# Patient Record
Sex: Male | Born: 1945 | Race: White | Hispanic: No | Marital: Married | Smoking: Former smoker
Health system: Southern US, Community
[De-identification: ages and names within clinical notes are randomized; demographics above are authoritative.]

## PROBLEM LIST (undated history)

## (undated) DIAGNOSIS — J189 Pneumonia, unspecified organism: Secondary | ICD-10-CM

## (undated) DIAGNOSIS — M1990 Unspecified osteoarthritis, unspecified site: Secondary | ICD-10-CM

## (undated) DIAGNOSIS — M549 Dorsalgia, unspecified: Secondary | ICD-10-CM

## (undated) DIAGNOSIS — G709 Myoneural disorder, unspecified: Secondary | ICD-10-CM

## (undated) DIAGNOSIS — G4730 Sleep apnea, unspecified: Secondary | ICD-10-CM

## (undated) HISTORY — DX: Dorsalgia, unspecified: M54.9

## (undated) HISTORY — PX: LUMBAR SPINE SURGERY: SHX701

## (undated) HISTORY — PX: WISDOM TOOTH EXTRACTION: SHX21

## (undated) HISTORY — DX: Sleep apnea, unspecified: G47.30

---

## 2009-06-26 HISTORY — PX: LUMBAR SPINE SURGERY: SHX701

## 2009-07-01 ENCOUNTER — Inpatient Hospital Stay (HOSPITAL_COMMUNITY): Admission: RE | Admit: 2009-07-01 | Discharge: 2009-07-04 | Payer: Self-pay | Admitting: Neurosurgery

## 2010-09-11 LAB — BASIC METABOLIC PANEL
CO2: 33 mEq/L — ABNORMAL HIGH (ref 19–32)
Calcium: 8.4 mg/dL (ref 8.4–10.5)
Chloride: 100 mEq/L (ref 96–112)
Creatinine, Ser: 0.86 mg/dL (ref 0.4–1.5)
Glucose, Bld: 97 mg/dL (ref 70–99)

## 2010-09-11 LAB — ABO/RH: ABO/RH(D): A NEG

## 2010-09-11 LAB — CBC
Hemoglobin: 12.8 g/dL — ABNORMAL LOW (ref 13.0–17.0)
Hemoglobin: 15.2 g/dL (ref 13.0–17.0)
MCHC: 34.3 g/dL (ref 30.0–36.0)
MCHC: 35 g/dL (ref 30.0–36.0)
MCV: 91.1 fL (ref 78.0–100.0)
MCV: 92.1 fL (ref 78.0–100.0)
RBC: 4.78 MIL/uL (ref 4.22–5.81)
RDW: 12.6 % (ref 11.5–15.5)
WBC: 8.7 10*3/uL (ref 4.0–10.5)

## 2013-04-22 ENCOUNTER — Other Ambulatory Visit: Payer: Self-pay | Admitting: Neurosurgery

## 2013-04-22 DIAGNOSIS — M5416 Radiculopathy, lumbar region: Secondary | ICD-10-CM

## 2013-04-30 ENCOUNTER — Ambulatory Visit
Admission: RE | Admit: 2013-04-30 | Discharge: 2013-04-30 | Disposition: A | Discharge status: Home / Self Care | Payer: Medicare Other | Source: Ambulatory Visit | Attending: Neurosurgery | Admitting: Neurosurgery

## 2013-04-30 DIAGNOSIS — M5416 Radiculopathy, lumbar region: Secondary | ICD-10-CM

## 2013-04-30 MED ORDER — GADOBENATE DIMEGLUMINE 529 MG/ML IV SOLN
20.0000 mL | Freq: Once | INTRAVENOUS | Status: AC | PRN
  Administered 2013-04-30: 20 mL via INTRAVENOUS

## 2013-09-26 DIAGNOSIS — Z125 Encounter for screening for malignant neoplasm of prostate: Secondary | ICD-10-CM | POA: Diagnosis not present

## 2013-09-26 DIAGNOSIS — Z23 Encounter for immunization: Secondary | ICD-10-CM | POA: Diagnosis not present

## 2013-09-26 DIAGNOSIS — Z1212 Encounter for screening for malignant neoplasm of rectum: Secondary | ICD-10-CM | POA: Diagnosis not present

## 2013-09-26 DIAGNOSIS — Z0289 Encounter for other administrative examinations: Secondary | ICD-10-CM | POA: Diagnosis not present

## 2013-09-26 DIAGNOSIS — Z713 Dietary counseling and surveillance: Secondary | ICD-10-CM | POA: Diagnosis not present

## 2013-09-26 DIAGNOSIS — Z719 Counseling, unspecified: Secondary | ICD-10-CM | POA: Diagnosis not present

## 2013-09-26 DIAGNOSIS — E785 Hyperlipidemia, unspecified: Secondary | ICD-10-CM | POA: Diagnosis not present

## 2013-09-26 DIAGNOSIS — M549 Dorsalgia, unspecified: Secondary | ICD-10-CM | POA: Diagnosis not present

## 2013-10-02 DIAGNOSIS — Z1389 Encounter for screening for other disorder: Secondary | ICD-10-CM | POA: Diagnosis not present

## 2013-10-02 DIAGNOSIS — Z87891 Personal history of nicotine dependence: Secondary | ICD-10-CM | POA: Diagnosis not present

## 2013-11-09 DIAGNOSIS — Z719 Counseling, unspecified: Secondary | ICD-10-CM | POA: Diagnosis not present

## 2013-11-09 DIAGNOSIS — J069 Acute upper respiratory infection, unspecified: Secondary | ICD-10-CM | POA: Diagnosis not present

## 2013-11-18 DIAGNOSIS — D485 Neoplasm of uncertain behavior of skin: Secondary | ICD-10-CM | POA: Diagnosis not present

## 2013-11-18 DIAGNOSIS — L820 Inflamed seborrheic keratosis: Secondary | ICD-10-CM | POA: Diagnosis not present

## 2013-11-18 DIAGNOSIS — D1800 Hemangioma unspecified site: Secondary | ICD-10-CM | POA: Diagnosis not present

## 2013-11-18 DIAGNOSIS — L570 Actinic keratosis: Secondary | ICD-10-CM | POA: Diagnosis not present

## 2013-11-25 DIAGNOSIS — E785 Hyperlipidemia, unspecified: Secondary | ICD-10-CM | POA: Diagnosis not present

## 2013-11-25 DIAGNOSIS — M1990 Unspecified osteoarthritis, unspecified site: Secondary | ICD-10-CM | POA: Diagnosis not present

## 2013-11-25 DIAGNOSIS — M545 Low back pain, unspecified: Secondary | ICD-10-CM | POA: Diagnosis not present

## 2013-11-25 DIAGNOSIS — G8929 Other chronic pain: Secondary | ICD-10-CM | POA: Diagnosis not present

## 2013-12-09 DIAGNOSIS — M549 Dorsalgia, unspecified: Secondary | ICD-10-CM | POA: Diagnosis not present

## 2013-12-09 DIAGNOSIS — M545 Low back pain, unspecified: Secondary | ICD-10-CM | POA: Diagnosis not present

## 2013-12-09 DIAGNOSIS — IMO0002 Reserved for concepts with insufficient information to code with codable children: Secondary | ICD-10-CM | POA: Diagnosis not present

## 2013-12-09 DIAGNOSIS — M961 Postlaminectomy syndrome, not elsewhere classified: Secondary | ICD-10-CM | POA: Diagnosis not present

## 2014-03-24 DIAGNOSIS — H6120 Impacted cerumen, unspecified ear: Secondary | ICD-10-CM | POA: Diagnosis not present

## 2014-03-24 DIAGNOSIS — H6980 Other specified disorders of Eustachian tube, unspecified ear: Secondary | ICD-10-CM | POA: Diagnosis not present

## 2014-03-24 DIAGNOSIS — J3489 Other specified disorders of nose and nasal sinuses: Secondary | ICD-10-CM | POA: Diagnosis not present

## 2014-03-24 DIAGNOSIS — H903 Sensorineural hearing loss, bilateral: Secondary | ICD-10-CM | POA: Diagnosis not present

## 2014-04-01 DIAGNOSIS — Z6834 Body mass index (BMI) 34.0-34.9, adult: Secondary | ICD-10-CM | POA: Diagnosis not present

## 2014-04-01 DIAGNOSIS — M5416 Radiculopathy, lumbar region: Secondary | ICD-10-CM | POA: Diagnosis not present

## 2014-04-01 DIAGNOSIS — M961 Postlaminectomy syndrome, not elsewhere classified: Secondary | ICD-10-CM | POA: Diagnosis not present

## 2014-04-01 DIAGNOSIS — M545 Low back pain: Secondary | ICD-10-CM | POA: Diagnosis not present

## 2014-05-28 DIAGNOSIS — M549 Dorsalgia, unspecified: Secondary | ICD-10-CM | POA: Diagnosis not present

## 2014-05-28 DIAGNOSIS — R030 Elevated blood-pressure reading, without diagnosis of hypertension: Secondary | ICD-10-CM | POA: Diagnosis not present

## 2014-05-28 DIAGNOSIS — E784 Other hyperlipidemia: Secondary | ICD-10-CM | POA: Diagnosis not present

## 2014-05-28 DIAGNOSIS — Z79899 Other long term (current) drug therapy: Secondary | ICD-10-CM | POA: Diagnosis not present

## 2014-06-24 DIAGNOSIS — M5416 Radiculopathy, lumbar region: Secondary | ICD-10-CM | POA: Diagnosis not present

## 2014-06-24 DIAGNOSIS — Z6834 Body mass index (BMI) 34.0-34.9, adult: Secondary | ICD-10-CM | POA: Diagnosis not present

## 2014-06-24 DIAGNOSIS — M545 Low back pain: Secondary | ICD-10-CM | POA: Diagnosis not present

## 2014-06-24 DIAGNOSIS — M961 Postlaminectomy syndrome, not elsewhere classified: Secondary | ICD-10-CM | POA: Diagnosis not present

## 2014-10-06 DIAGNOSIS — Z125 Encounter for screening for malignant neoplasm of prostate: Secondary | ICD-10-CM | POA: Diagnosis not present

## 2014-10-06 DIAGNOSIS — Z79899 Other long term (current) drug therapy: Secondary | ICD-10-CM | POA: Diagnosis not present

## 2014-10-06 DIAGNOSIS — E786 Lipoprotein deficiency: Secondary | ICD-10-CM | POA: Diagnosis not present

## 2014-10-06 DIAGNOSIS — M25511 Pain in right shoulder: Secondary | ICD-10-CM | POA: Diagnosis not present

## 2014-10-06 DIAGNOSIS — M899 Disorder of bone, unspecified: Secondary | ICD-10-CM | POA: Diagnosis not present

## 2014-10-06 DIAGNOSIS — E784 Other hyperlipidemia: Secondary | ICD-10-CM | POA: Diagnosis not present

## 2014-10-06 DIAGNOSIS — Z0000 Encounter for general adult medical examination without abnormal findings: Secondary | ICD-10-CM | POA: Diagnosis not present

## 2014-10-06 DIAGNOSIS — Z1212 Encounter for screening for malignant neoplasm of rectum: Secondary | ICD-10-CM | POA: Diagnosis not present

## 2014-10-06 DIAGNOSIS — M549 Dorsalgia, unspecified: Secondary | ICD-10-CM | POA: Diagnosis not present

## 2014-10-06 DIAGNOSIS — I10 Essential (primary) hypertension: Secondary | ICD-10-CM | POA: Diagnosis not present

## 2014-10-06 DIAGNOSIS — Z23 Encounter for immunization: Secondary | ICD-10-CM | POA: Diagnosis not present

## 2014-10-06 DIAGNOSIS — Z719 Counseling, unspecified: Secondary | ICD-10-CM | POA: Diagnosis not present

## 2014-10-13 DIAGNOSIS — M545 Low back pain: Secondary | ICD-10-CM | POA: Diagnosis not present

## 2014-10-13 DIAGNOSIS — M5416 Radiculopathy, lumbar region: Secondary | ICD-10-CM | POA: Diagnosis not present

## 2014-10-13 DIAGNOSIS — M961 Postlaminectomy syndrome, not elsewhere classified: Secondary | ICD-10-CM | POA: Diagnosis not present

## 2014-11-17 DIAGNOSIS — D1800 Hemangioma unspecified site: Secondary | ICD-10-CM | POA: Diagnosis not present

## 2014-11-17 DIAGNOSIS — D485 Neoplasm of uncertain behavior of skin: Secondary | ICD-10-CM | POA: Diagnosis not present

## 2014-11-17 DIAGNOSIS — L570 Actinic keratosis: Secondary | ICD-10-CM | POA: Diagnosis not present

## 2014-11-17 DIAGNOSIS — L820 Inflamed seborrheic keratosis: Secondary | ICD-10-CM | POA: Diagnosis not present

## 2014-12-22 DIAGNOSIS — R310 Gross hematuria: Secondary | ICD-10-CM | POA: Diagnosis not present

## 2014-12-22 DIAGNOSIS — N138 Other obstructive and reflux uropathy: Secondary | ICD-10-CM | POA: Diagnosis not present

## 2014-12-22 DIAGNOSIS — R109 Unspecified abdominal pain: Secondary | ICD-10-CM | POA: Diagnosis not present

## 2014-12-22 DIAGNOSIS — N400 Enlarged prostate without lower urinary tract symptoms: Secondary | ICD-10-CM | POA: Diagnosis not present

## 2014-12-22 DIAGNOSIS — D1771 Benign lipomatous neoplasm of kidney: Secondary | ICD-10-CM | POA: Diagnosis not present

## 2014-12-22 DIAGNOSIS — N281 Cyst of kidney, acquired: Secondary | ICD-10-CM | POA: Diagnosis not present

## 2014-12-22 DIAGNOSIS — N401 Enlarged prostate with lower urinary tract symptoms: Secondary | ICD-10-CM | POA: Diagnosis not present

## 2015-01-06 DIAGNOSIS — M5416 Radiculopathy, lumbar region: Secondary | ICD-10-CM | POA: Diagnosis not present

## 2015-01-06 DIAGNOSIS — Z6834 Body mass index (BMI) 34.0-34.9, adult: Secondary | ICD-10-CM | POA: Diagnosis not present

## 2015-01-06 DIAGNOSIS — M961 Postlaminectomy syndrome, not elsewhere classified: Secondary | ICD-10-CM | POA: Diagnosis not present

## 2015-01-06 DIAGNOSIS — M545 Low back pain: Secondary | ICD-10-CM | POA: Diagnosis not present

## 2015-01-20 DIAGNOSIS — R310 Gross hematuria: Secondary | ICD-10-CM | POA: Diagnosis not present

## 2015-01-20 DIAGNOSIS — N401 Enlarged prostate with lower urinary tract symptoms: Secondary | ICD-10-CM | POA: Diagnosis not present

## 2015-01-20 DIAGNOSIS — R3914 Feeling of incomplete bladder emptying: Secondary | ICD-10-CM | POA: Diagnosis not present

## 2015-04-06 DIAGNOSIS — M545 Low back pain: Secondary | ICD-10-CM | POA: Diagnosis not present

## 2015-04-06 DIAGNOSIS — Z6834 Body mass index (BMI) 34.0-34.9, adult: Secondary | ICD-10-CM | POA: Diagnosis not present

## 2015-04-06 DIAGNOSIS — M5416 Radiculopathy, lumbar region: Secondary | ICD-10-CM | POA: Diagnosis not present

## 2015-04-06 DIAGNOSIS — M961 Postlaminectomy syndrome, not elsewhere classified: Secondary | ICD-10-CM | POA: Diagnosis not present

## 2015-05-02 DIAGNOSIS — J189 Pneumonia, unspecified organism: Secondary | ICD-10-CM | POA: Diagnosis not present

## 2015-05-02 DIAGNOSIS — I10 Essential (primary) hypertension: Secondary | ICD-10-CM | POA: Diagnosis not present

## 2015-05-02 DIAGNOSIS — Z79899 Other long term (current) drug therapy: Secondary | ICD-10-CM | POA: Diagnosis not present

## 2015-05-02 DIAGNOSIS — R509 Fever, unspecified: Secondary | ICD-10-CM | POA: Diagnosis not present

## 2015-05-02 DIAGNOSIS — R0989 Other specified symptoms and signs involving the circulatory and respiratory systems: Secondary | ICD-10-CM | POA: Diagnosis not present

## 2015-05-02 DIAGNOSIS — R590 Localized enlarged lymph nodes: Secondary | ICD-10-CM | POA: Diagnosis not present

## 2015-05-02 DIAGNOSIS — R072 Precordial pain: Secondary | ICD-10-CM | POA: Diagnosis not present

## 2015-05-02 DIAGNOSIS — Z885 Allergy status to narcotic agent status: Secondary | ICD-10-CM | POA: Diagnosis not present

## 2015-05-03 DIAGNOSIS — J189 Pneumonia, unspecified organism: Secondary | ICD-10-CM | POA: Diagnosis not present

## 2015-05-03 DIAGNOSIS — I10 Essential (primary) hypertension: Secondary | ICD-10-CM | POA: Diagnosis not present

## 2015-05-03 DIAGNOSIS — R590 Localized enlarged lymph nodes: Secondary | ICD-10-CM | POA: Diagnosis not present

## 2015-05-17 DIAGNOSIS — R3912 Poor urinary stream: Secondary | ICD-10-CM | POA: Diagnosis not present

## 2015-05-17 DIAGNOSIS — N138 Other obstructive and reflux uropathy: Secondary | ICD-10-CM | POA: Diagnosis not present

## 2015-05-17 DIAGNOSIS — R300 Dysuria: Secondary | ICD-10-CM | POA: Diagnosis not present

## 2015-05-17 DIAGNOSIS — R310 Gross hematuria: Secondary | ICD-10-CM | POA: Diagnosis not present

## 2015-05-17 DIAGNOSIS — N401 Enlarged prostate with lower urinary tract symptoms: Secondary | ICD-10-CM | POA: Diagnosis not present

## 2015-05-26 DIAGNOSIS — D485 Neoplasm of uncertain behavior of skin: Secondary | ICD-10-CM | POA: Diagnosis not present

## 2015-05-26 DIAGNOSIS — C44529 Squamous cell carcinoma of skin of other part of trunk: Secondary | ICD-10-CM | POA: Diagnosis not present

## 2015-06-07 DIAGNOSIS — J189 Pneumonia, unspecified organism: Secondary | ICD-10-CM | POA: Diagnosis not present

## 2015-06-07 DIAGNOSIS — R938 Abnormal findings on diagnostic imaging of other specified body structures: Secondary | ICD-10-CM | POA: Diagnosis not present

## 2015-06-17 DIAGNOSIS — C44529 Squamous cell carcinoma of skin of other part of trunk: Secondary | ICD-10-CM | POA: Diagnosis not present

## 2015-07-06 DIAGNOSIS — M961 Postlaminectomy syndrome, not elsewhere classified: Secondary | ICD-10-CM | POA: Diagnosis not present

## 2015-07-06 DIAGNOSIS — M5416 Radiculopathy, lumbar region: Secondary | ICD-10-CM | POA: Diagnosis not present

## 2015-07-06 DIAGNOSIS — M545 Low back pain: Secondary | ICD-10-CM | POA: Diagnosis not present

## 2015-07-19 DIAGNOSIS — R938 Abnormal findings on diagnostic imaging of other specified body structures: Secondary | ICD-10-CM | POA: Diagnosis not present

## 2015-08-26 DIAGNOSIS — H2513 Age-related nuclear cataract, bilateral: Secondary | ICD-10-CM | POA: Diagnosis not present

## 2015-08-26 DIAGNOSIS — H04123 Dry eye syndrome of bilateral lacrimal glands: Secondary | ICD-10-CM | POA: Diagnosis not present

## 2015-08-26 DIAGNOSIS — H40013 Open angle with borderline findings, low risk, bilateral: Secondary | ICD-10-CM | POA: Diagnosis not present

## 2015-09-14 DIAGNOSIS — R3912 Poor urinary stream: Secondary | ICD-10-CM | POA: Diagnosis not present

## 2015-09-14 DIAGNOSIS — N401 Enlarged prostate with lower urinary tract symptoms: Secondary | ICD-10-CM | POA: Diagnosis not present

## 2015-09-14 DIAGNOSIS — N138 Other obstructive and reflux uropathy: Secondary | ICD-10-CM | POA: Diagnosis not present

## 2015-09-14 DIAGNOSIS — Z0000 Encounter for general adult medical examination without abnormal findings: Secondary | ICD-10-CM | POA: Diagnosis not present

## 2015-09-28 DIAGNOSIS — M545 Low back pain: Secondary | ICD-10-CM | POA: Diagnosis not present

## 2015-09-28 DIAGNOSIS — M5416 Radiculopathy, lumbar region: Secondary | ICD-10-CM | POA: Diagnosis not present

## 2015-09-28 DIAGNOSIS — M961 Postlaminectomy syndrome, not elsewhere classified: Secondary | ICD-10-CM | POA: Diagnosis not present

## 2015-12-14 DIAGNOSIS — L570 Actinic keratosis: Secondary | ICD-10-CM | POA: Diagnosis not present

## 2015-12-22 DIAGNOSIS — M961 Postlaminectomy syndrome, not elsewhere classified: Secondary | ICD-10-CM | POA: Diagnosis not present

## 2015-12-22 DIAGNOSIS — M5416 Radiculopathy, lumbar region: Secondary | ICD-10-CM | POA: Diagnosis not present

## 2015-12-22 DIAGNOSIS — M545 Low back pain: Secondary | ICD-10-CM | POA: Diagnosis not present

## 2016-03-09 DIAGNOSIS — M961 Postlaminectomy syndrome, not elsewhere classified: Secondary | ICD-10-CM | POA: Diagnosis not present

## 2016-03-09 DIAGNOSIS — M545 Low back pain: Secondary | ICD-10-CM | POA: Diagnosis not present

## 2016-03-09 DIAGNOSIS — Z6834 Body mass index (BMI) 34.0-34.9, adult: Secondary | ICD-10-CM | POA: Diagnosis not present

## 2016-03-09 DIAGNOSIS — M5416 Radiculopathy, lumbar region: Secondary | ICD-10-CM | POA: Diagnosis not present

## 2016-05-31 DIAGNOSIS — R030 Elevated blood-pressure reading, without diagnosis of hypertension: Secondary | ICD-10-CM | POA: Diagnosis not present

## 2016-05-31 DIAGNOSIS — M961 Postlaminectomy syndrome, not elsewhere classified: Secondary | ICD-10-CM | POA: Diagnosis not present

## 2016-05-31 DIAGNOSIS — Z6834 Body mass index (BMI) 34.0-34.9, adult: Secondary | ICD-10-CM | POA: Diagnosis not present

## 2016-08-31 DIAGNOSIS — H2513 Age-related nuclear cataract, bilateral: Secondary | ICD-10-CM | POA: Diagnosis not present

## 2016-08-31 DIAGNOSIS — H40013 Open angle with borderline findings, low risk, bilateral: Secondary | ICD-10-CM | POA: Diagnosis not present

## 2016-09-06 DIAGNOSIS — M961 Postlaminectomy syndrome, not elsewhere classified: Secondary | ICD-10-CM | POA: Diagnosis not present

## 2016-11-06 DIAGNOSIS — R300 Dysuria: Secondary | ICD-10-CM | POA: Diagnosis not present

## 2016-11-06 DIAGNOSIS — R350 Frequency of micturition: Secondary | ICD-10-CM | POA: Diagnosis not present

## 2016-11-06 DIAGNOSIS — R3915 Urgency of urination: Secondary | ICD-10-CM | POA: Diagnosis not present

## 2016-11-06 DIAGNOSIS — N401 Enlarged prostate with lower urinary tract symptoms: Secondary | ICD-10-CM | POA: Diagnosis not present

## 2016-12-06 DIAGNOSIS — M5416 Radiculopathy, lumbar region: Secondary | ICD-10-CM | POA: Diagnosis not present

## 2016-12-06 DIAGNOSIS — Z6834 Body mass index (BMI) 34.0-34.9, adult: Secondary | ICD-10-CM | POA: Diagnosis not present

## 2016-12-06 DIAGNOSIS — M961 Postlaminectomy syndrome, not elsewhere classified: Secondary | ICD-10-CM | POA: Diagnosis not present

## 2016-12-06 DIAGNOSIS — R030 Elevated blood-pressure reading, without diagnosis of hypertension: Secondary | ICD-10-CM | POA: Diagnosis not present

## 2016-12-06 DIAGNOSIS — M545 Low back pain: Secondary | ICD-10-CM | POA: Diagnosis not present

## 2016-12-13 DIAGNOSIS — L570 Actinic keratosis: Secondary | ICD-10-CM | POA: Diagnosis not present

## 2016-12-13 DIAGNOSIS — Z85828 Personal history of other malignant neoplasm of skin: Secondary | ICD-10-CM | POA: Diagnosis not present

## 2017-03-06 DIAGNOSIS — M961 Postlaminectomy syndrome, not elsewhere classified: Secondary | ICD-10-CM | POA: Diagnosis not present

## 2017-03-06 DIAGNOSIS — M545 Low back pain: Secondary | ICD-10-CM | POA: Diagnosis not present

## 2017-03-06 DIAGNOSIS — Z6833 Body mass index (BMI) 33.0-33.9, adult: Secondary | ICD-10-CM | POA: Diagnosis not present

## 2017-03-06 DIAGNOSIS — R030 Elevated blood-pressure reading, without diagnosis of hypertension: Secondary | ICD-10-CM | POA: Diagnosis not present

## 2017-04-02 DIAGNOSIS — G4730 Sleep apnea, unspecified: Secondary | ICD-10-CM | POA: Diagnosis not present

## 2017-04-02 DIAGNOSIS — M549 Dorsalgia, unspecified: Secondary | ICD-10-CM | POA: Diagnosis not present

## 2017-04-02 DIAGNOSIS — R5383 Other fatigue: Secondary | ICD-10-CM | POA: Diagnosis not present

## 2017-04-02 DIAGNOSIS — N400 Enlarged prostate without lower urinary tract symptoms: Secondary | ICD-10-CM | POA: Diagnosis not present

## 2017-04-02 DIAGNOSIS — R0602 Shortness of breath: Secondary | ICD-10-CM | POA: Diagnosis not present

## 2017-04-03 ENCOUNTER — Telehealth: Payer: Self-pay

## 2017-04-03 NOTE — Telephone Encounter (Signed)
SENT NOTES TO SCHEDULING 

## 2017-04-09 ENCOUNTER — Telehealth: Payer: Self-pay | Admitting: Cardiology

## 2017-04-09 NOTE — Telephone Encounter (Signed)
Received records from [place] for appointment on 04/10/17 with Dr Percival Spanish.  Records put with Dr Hochrein's schedule for 04/10/17. lp

## 2017-04-09 NOTE — Progress Notes (Signed)
Cardiology Office Note   Date:  04/11/2017   ID:  Juan Cooper, DOB June 09, 1946, MRN [id number]  PCP:  Patient, No Pcp Per  Cardiologist:   Minus Breeding, MD  Referring:  London Pepper, MD     No chief complaint on file.     History of Present Illness: Juan Cooper is a 71 y.o. male who is referred by London Pepper, MD for evaluation of SOB.  The patient has no past cardiac history. He did have a stress test he said in 2011 in [place] and this was apparently normal.  He is referred because he's been getting some shortness of breath. Climbing an incline which is 300 feet in his backyard and he has to stop when he gets into the house. He has to recover for a few minutes. Get short of breath and slowly more tired. He says he notices in the summer when it was to humid but he thinks it might be better now is getting cooler he just hasn't tried.  He denies any chest pressure, neck or arm discomfort. He's had no palpitations, presyncope or syncope. He's had a slow and steady weight gain through his effort over the years and he's not describing any edema.  Of note he is limited by chronic back pain but he can do some activities such as walking incline.  Past Medical History:  Diagnosis Date  . Back pain   . Sleep apnea    No CPAP    Past Surgical History:  Procedure Laterality Date  . LUMBAR SPINE SURGERY       Current Outpatient Prescriptions  Medication Sig Dispense Refill  . HYDROcodone-acetaminophen (NORCO) 10-325 MG tablet Take 1 tablet by mouth 3 (three) times daily with meals as needed.    . tamsulosin (FLOMAX) 0.4 MG CAPS capsule Take 1 capsule by mouth daily.    Marland Kitchen tiZANidine (ZANAFLEX) 2 MG tablet Take 1 tablet by mouth at bedtime as needed.     No current facility-administered medications for this visit.     Allergies:   Codeine    Social History:  The patient  reports that he has quit smoking. He has never used smokeless tobacco.   Family History:  The  patient's family history includes Deep vein thrombosis in his father; Heart attack (age of onset: 60) in his father; Hyperlipidemia in his father; Ovarian cancer in his mother; Stroke in his sister.    ROS:  Please see the history of present illness.   Otherwise, review of systems are positive for none.   All other systems are reviewed and negative.    PHYSICAL EXAM: VS:  BP 136/80   Pulse 70   Ht 5\' 7"  (1.702 m)   Wt 214 lb (97.1 kg)   BMI 33.52 kg/m  , BMI Body mass index is 33.52 kg/m. GENERAL:  Well appearing HEENT:  Pupils equal round and reactive, fundi not visualized, oral mucosa unremarkable NECK:  No jugular venous distention, waveform within normal limits, carotid upstroke brisk and symmetric, no bruits, no thyromegaly LYMPHATICS:  No cervical, inguinal adenopathy LUNGS:  Clear to auscultation bilaterally BACK:  No CVA tenderness CHEST:  Unremarkable HEART:  PMI not displaced or sustained,S1 and S2 within normal limits, no S3, no S4, no clicks, no rubs, no murmurs ABD:  Flat, positive bowel sounds normal in frequency in pitch, no bruits, no rebound, no guarding, no midline pulsatile mass, no hepatomegaly, no splenomegaly EXT:  2 plus pulses throughout, no  edema, no cyanosis no clubbing SKIN:  No rashes no nodules NEURO:  Cranial nerves II through XII grossly intact, motor grossly intact throughout PSYCH:  Cognitively intact, oriented to person place and time    EKG:  EKG is ordered today. The ekg ordered today demonstrates sinus rhythm, rate 70, axis within normal limits, intervals within normal limits, no acute ST-T wave changes.   Recent Labs: No results found for requested labs within last 8760 hours.    Lipid Panel    Component Value Date/Time   CHOL 205 (H) 04/10/2017 0936   TRIG 196 (H) 04/10/2017 0936   HDL 33 (L) 04/10/2017 0936   CHOLHDL 6.2 (H) 04/10/2017 0936   LDLCALC 133 (H) 04/10/2017 0936      Wt Readings from Last 3 Encounters:  04/10/17 214  lb (97.1 kg)      Other studies Reviewed: Additional studies/ records that were reviewed today include: Officer records and labs. Review of the above records demonstrates:  Please see elsewhere in the note.     ASSESSMENT AND PLAN:  SOB:  The patient has mild cardiovascular risk factors and the pretest probability of obstructive coronary disease is relatively low.  I will bring the patient back for a POET (Plain Old Exercise Test). This will allow me to screen for obstructive coronary disease, risk stratify and very importantly provide a prescription for exercise.  He has a low functional status. I think he be able to do a treadmill well enough to get were relieved. He is willing to give it a try.  RISK REDUCTION:  I will check a lipid profile.  OBESITY:  He has lost 30 pounds over the years and I encourage more of the same.   Current medicines are reviewed at length with the patient today.  The patient does not have concerns regarding medicines.  The following changes have been made:  no change  Labs/ tests ordered today include:   Orders Placed This Encounter  Procedures  . Lipid panel  . Exercise Tolerance Test  . EKG 12-Lead     Disposition:   FU with me as needed.      Signed, Minus Breeding, MD  04/11/2017 2:39 PM    [facility]

## 2017-04-10 ENCOUNTER — Encounter (INDEPENDENT_AMBULATORY_CARE_PROVIDER_SITE_OTHER): Payer: Self-pay

## 2017-04-10 ENCOUNTER — Ambulatory Visit (INDEPENDENT_AMBULATORY_CARE_PROVIDER_SITE_OTHER): Payer: Medicare Other | Admitting: Cardiology

## 2017-04-10 ENCOUNTER — Encounter: Payer: Self-pay | Admitting: Cardiology

## 2017-04-10 VITALS — BP 136/80 | HR 70 | Ht 67.0 in | Wt 214.0 lb

## 2017-04-10 DIAGNOSIS — Z13220 Encounter for screening for lipoid disorders: Secondary | ICD-10-CM

## 2017-04-10 DIAGNOSIS — E669 Obesity, unspecified: Secondary | ICD-10-CM | POA: Diagnosis not present

## 2017-04-10 DIAGNOSIS — R0602 Shortness of breath: Secondary | ICD-10-CM

## 2017-04-10 LAB — LIPID PANEL
CHOL/HDL RATIO: 6.2 ratio — AB (ref 0.0–5.0)
Cholesterol, Total: 205 mg/dL — ABNORMAL HIGH (ref 100–199)
HDL: 33 mg/dL — ABNORMAL LOW (ref >39)
LDL CALC: 133 mg/dL — AB (ref 0–99)
Triglycerides: 196 mg/dL — ABNORMAL HIGH (ref 0–149)
VLDL CHOLESTEROL CAL: 39 mg/dL (ref 5–40)

## 2017-04-10 NOTE — Patient Instructions (Signed)
Medication Instructions:  Continue current medications  If you need a refill on your cardiac medications before your next appointment, please call your pharmacy.  Labwork: Fasting Lipids HERE IN OUR OFFICE AT [facility]  Testing/Procedures: Your physician has requested that you have an exercise tolerance test. For further information please visit [email or web address]. Please also follow instruction sheet, as given.  Follow-Up: Your physician wants you to follow-up in: As Needed.    Thank you for choosing [facility] at [facility]!!

## 2017-04-11 ENCOUNTER — Encounter: Payer: Self-pay | Admitting: Cardiology

## 2017-04-19 ENCOUNTER — Telehealth (HOSPITAL_COMMUNITY): Payer: Self-pay

## 2017-04-19 NOTE — Telephone Encounter (Signed)
Encounter complete. 

## 2017-04-20 ENCOUNTER — Telehealth (HOSPITAL_COMMUNITY): Payer: Self-pay | Admitting: *Deleted

## 2017-04-20 NOTE — Telephone Encounter (Signed)
Close encounter 

## 2017-04-23 ENCOUNTER — Telehealth: Payer: Self-pay | Admitting: *Deleted

## 2017-04-23 DIAGNOSIS — E785 Hyperlipidemia, unspecified: Secondary | ICD-10-CM

## 2017-04-23 MED ORDER — ATORVASTATIN CALCIUM 20 MG PO TABS: 20.0000 mg | ORAL_TABLET | Freq: Every day | ORAL | 3 refills | Status: AC

## 2017-04-23 NOTE — Telephone Encounter (Signed)
Pt aware of his blood work atorvastatin 20 mg 1 po qd was send to pt [employer] in [place]. Lipids profile was ordered and mailed to pt to get done in 3 month.

## 2017-04-23 NOTE — Telephone Encounter (Signed)
-----   Message from Minus Breeding, MD sent at 04/22/2017  8:24 PM EDT ----- LDL is 133.  I would suggest starting a statin with Lipitor 20 mg one po daily disp number 30 with 11 refills.  Call Mr. Sontag with the results and send results to Patient,

## 2017-04-24 ENCOUNTER — Ambulatory Visit (HOSPITAL_COMMUNITY)
Admission: RE | Admit: 2017-04-24 | Discharge: 2017-04-24 | Disposition: A | Discharge status: Home / Self Care | Payer: Medicare Other | Source: Ambulatory Visit | Attending: Cardiovascular Disease | Admitting: Cardiovascular Disease

## 2017-04-24 DIAGNOSIS — R0602 Shortness of breath: Secondary | ICD-10-CM | POA: Insufficient documentation

## 2017-04-24 LAB — EXERCISE TOLERANCE TEST
CSEPEDS: 0 s
CSEPEW: 7 METS
CSEPHR: 93 %
CSEPPHR: 141 {beats}/min
Exercise duration (min): 6 min
MPHR: 151 {beats}/min
RPE: 18
Rest HR: 65 {beats}/min

## 2017-05-02 DIAGNOSIS — R0683 Snoring: Secondary | ICD-10-CM | POA: Diagnosis not present

## 2017-05-02 DIAGNOSIS — G4762 Sleep related leg cramps: Secondary | ICD-10-CM | POA: Diagnosis not present

## 2017-05-02 DIAGNOSIS — G4733 Obstructive sleep apnea (adult) (pediatric): Secondary | ICD-10-CM | POA: Diagnosis not present

## 2017-05-23 ENCOUNTER — Encounter: Payer: Self-pay | Admitting: Cardiology

## 2017-05-31 ENCOUNTER — Ambulatory Visit: Payer: Medicare Other | Admitting: Cardiology

## 2017-05-31 DIAGNOSIS — Z6833 Body mass index (BMI) 33.0-33.9, adult: Secondary | ICD-10-CM | POA: Diagnosis not present

## 2017-05-31 DIAGNOSIS — M5416 Radiculopathy, lumbar region: Secondary | ICD-10-CM | POA: Diagnosis not present

## 2017-05-31 DIAGNOSIS — M961 Postlaminectomy syndrome, not elsewhere classified: Secondary | ICD-10-CM | POA: Diagnosis not present

## 2017-05-31 DIAGNOSIS — M545 Low back pain: Secondary | ICD-10-CM | POA: Diagnosis not present

## 2017-05-31 NOTE — Progress Notes (Signed)
Cardiology Office Note   Date:  06/01/2017   ID:  [name], DOB 04-Sep-1945, MRN [id number]  PCP:  Patient, No Pcp Per  Cardiologist:   Minus Breeding, MD      Chief Complaint  Patient presents with  . PVCs      History of Present Illness: Juan Cooper is a 71 y.o. male who presents for follow up of SOB.   I saw him in October.  He had ectopy both with rest and with exercise.  He presents for follow up.   He actually says that his breathing has been getting better.  He can now walk 300 feet up an incline in hs yard without any symptoms.   The patient denies any new symptoms such as chest discomfort, neck or arm discomfort. There has been no new shortness of breath, PND or orthopnea. There have been no reported palpitations, presyncope or syncope.    Past Medical History:  Diagnosis Date  . Back pain   . Sleep apnea    No CPAP     Current Outpatient Medications  Medication Sig Dispense Refill  . atorvastatin (LIPITOR) 20 MG tablet Take 1 tablet (20 mg total) by mouth daily. 90 tablet 3  . HYDROcodone-acetaminophen (NORCO) 10-325 MG tablet Take 1 tablet by mouth 3 (three) times daily with meals as needed.    . Magnesium 250 MG TABS Take 1 tablet by mouth as needed.    . tamsulosin (FLOMAX) 0.4 MG CAPS capsule Take 1 capsule by mouth daily.    Marland Kitchen tiZANidine (ZANAFLEX) 2 MG tablet Take 1 tablet by mouth at bedtime as needed.     No current facility-administered medications for this visit.     Allergies:   Codeine    ROS:  Please see the history of present illness.   Otherwise, review of systems are positive for none.   All other systems are reviewed and negative.    PHYSICAL EXAM: VS:  BP 138/78   Pulse 68   Ht 5\' 7"  (1.702 m)   Wt 216 lb 3.2 oz (98.1 kg)   BMI 33.86 kg/m  , BMI Body mass index is 33.86 kg/m.  GENERAL:  Well appearing NECK:  No jugular venous distention, waveform within normal limits, carotid upstroke brisk and symmetric, no bruits, no  thyromegaly LUNGS:  Clear to auscultation bilaterally CHEST:  Unremarkable HEART:  PMI not displaced or sustained,S1 and S2 within normal limits, no S3, no S4, no clicks, no rubs, soft systolic murmur heard only at the left upper sternal border, no diastolic murmurs ABD:  Flat, positive bowel sounds normal in frequency in pitch, no bruits, no rebound, no guarding, no midline pulsatile mass, no hepatomegaly, no splenomegaly EXT:  2 plus pulses throughout, no edema, no cyanosis no clubbing    EKG:  EKG is not ordered today. .   Recent Labs: No results found for requested labs within last 8760 hours.    Lipid Panel    Component Value Date/Time   CHOL 205 (H) 04/10/2017 0936   TRIG 196 (H) 04/10/2017 0936   HDL 33 (L) 04/10/2017 0936   CHOLHDL 6.2 (H) 04/10/2017 0936   LDLCALC 133 (H) 04/10/2017 0936      Wt Readings from Last 3 Encounters:  06/01/17 216 lb 3.2 oz (98.1 kg)  04/10/17 214 lb (97.1 kg)      Other studies Reviewed: Additional studies/ records that were reviewed today include: POET (Plain Old Exercise Treadmill) Review of  the above records demonstrates:      ASSESSMENT AND PLAN:  SOB:  This is improved.  He has baseline ectopy.  I do not think that the ectopy noted on his stress test indicates obstructive CAD.  He will continue with aggressive risk reduction.   ECTOPY:  He is not noticing this.  No change in therapy.   RISK REDUCTION:    I started Lipitor after the last visit and I will follow up with a lipid profile in Jan.   OBESITY:   He has not gained back the 30 lbs that he lost but has not lost more.  No change in therapy.    Current medicines are reviewed at length with the patient today.  The patient does not have concerns regarding medicines.  The following changes have been made:  None  Labs/ tests ordered today include: None  No orders of the defined types were placed in this encounter.    Disposition:   FU with me one year.

## 2017-06-01 ENCOUNTER — Ambulatory Visit (INDEPENDENT_AMBULATORY_CARE_PROVIDER_SITE_OTHER): Payer: Medicare Other | Admitting: Cardiology

## 2017-06-01 ENCOUNTER — Encounter: Payer: Self-pay | Admitting: Cardiology

## 2017-06-01 VITALS — BP 138/78 | HR 68 | Ht 67.0 in | Wt 216.2 lb

## 2017-06-01 DIAGNOSIS — R0602 Shortness of breath: Secondary | ICD-10-CM | POA: Diagnosis not present

## 2017-06-01 DIAGNOSIS — E669 Obesity, unspecified: Secondary | ICD-10-CM | POA: Insufficient documentation

## 2017-06-01 DIAGNOSIS — I493 Ventricular premature depolarization: Secondary | ICD-10-CM

## 2017-06-01 NOTE — Patient Instructions (Signed)
Medication Instructions:  Continue current medications  If you need a refill on your cardiac medications before your next appointment, please call your pharmacy.  Labwork: None ordered  Testing/Procedures: None Ordered   Follow-Up: Your physician wants you to follow-up in: 1 Year. You should receive a reminder letter in the mail two months in advance. If you do not receive a letter, please call our office [phone].    Thank you for choosing [facility] at [facility]!!      

## 2017-06-11 DIAGNOSIS — G4733 Obstructive sleep apnea (adult) (pediatric): Secondary | ICD-10-CM | POA: Diagnosis not present

## 2017-07-17 DIAGNOSIS — N138 Other obstructive and reflux uropathy: Secondary | ICD-10-CM | POA: Diagnosis not present

## 2017-07-17 DIAGNOSIS — N401 Enlarged prostate with lower urinary tract symptoms: Secondary | ICD-10-CM | POA: Diagnosis not present

## 2017-07-31 DIAGNOSIS — E785 Hyperlipidemia, unspecified: Secondary | ICD-10-CM | POA: Diagnosis not present

## 2017-07-31 LAB — LIPID PANEL
CHOL/HDL RATIO: 3.2 ratio (ref 0.0–5.0)
Cholesterol, Total: 113 mg/dL (ref 100–199)
HDL: 35 mg/dL — ABNORMAL LOW (ref >39)
LDL CALC: 52 mg/dL (ref 0–99)
Triglycerides: 130 mg/dL (ref 0–149)
VLDL Cholesterol Cal: 26 mg/dL (ref 5–40)

## 2017-08-02 ENCOUNTER — Telehealth: Payer: Self-pay | Admitting: Cardiology

## 2017-08-02 NOTE — Telephone Encounter (Signed)
New Message   Patient is returning call in reference to lab results. Please call to discuss.  

## 2017-08-02 NOTE — Telephone Encounter (Signed)
Advised patient of lab results  

## 2017-08-02 NOTE — Telephone Encounter (Signed)
-----   Message from Minus Breeding, MD sent at 08/01/2017  8:35 PM EST ----- Labs OK.  Call Mr. Sookdeo with the results.

## 2017-08-03 ENCOUNTER — Ambulatory Visit: Payer: Medicare Other | Admitting: Cardiology

## 2017-08-21 DIAGNOSIS — N401 Enlarged prostate with lower urinary tract symptoms: Secondary | ICD-10-CM | POA: Diagnosis not present

## 2017-08-27 DIAGNOSIS — M545 Low back pain: Secondary | ICD-10-CM | POA: Diagnosis not present

## 2017-08-27 DIAGNOSIS — R030 Elevated blood-pressure reading, without diagnosis of hypertension: Secondary | ICD-10-CM | POA: Diagnosis not present

## 2017-08-27 DIAGNOSIS — M961 Postlaminectomy syndrome, not elsewhere classified: Secondary | ICD-10-CM | POA: Diagnosis not present

## 2017-08-27 DIAGNOSIS — M5416 Radiculopathy, lumbar region: Secondary | ICD-10-CM | POA: Diagnosis not present

## 2017-08-27 DIAGNOSIS — Z6833 Body mass index (BMI) 33.0-33.9, adult: Secondary | ICD-10-CM | POA: Diagnosis not present

## 2017-08-28 DIAGNOSIS — N401 Enlarged prostate with lower urinary tract symptoms: Secondary | ICD-10-CM | POA: Diagnosis not present

## 2017-08-28 DIAGNOSIS — N138 Other obstructive and reflux uropathy: Secondary | ICD-10-CM | POA: Diagnosis not present

## 2017-09-06 DIAGNOSIS — H40013 Open angle with borderline findings, low risk, bilateral: Secondary | ICD-10-CM | POA: Diagnosis not present

## 2017-09-06 DIAGNOSIS — H25813 Combined forms of age-related cataract, bilateral: Secondary | ICD-10-CM | POA: Diagnosis not present

## 2017-09-10 DIAGNOSIS — G4733 Obstructive sleep apnea (adult) (pediatric): Secondary | ICD-10-CM | POA: Diagnosis not present

## 2017-10-02 DIAGNOSIS — N400 Enlarged prostate without lower urinary tract symptoms: Secondary | ICD-10-CM | POA: Diagnosis not present

## 2017-10-02 DIAGNOSIS — Z0000 Encounter for general adult medical examination without abnormal findings: Secondary | ICD-10-CM | POA: Diagnosis not present

## 2017-10-02 DIAGNOSIS — E785 Hyperlipidemia, unspecified: Secondary | ICD-10-CM | POA: Diagnosis not present

## 2017-10-02 DIAGNOSIS — G4730 Sleep apnea, unspecified: Secondary | ICD-10-CM | POA: Diagnosis not present

## 2017-11-15 DIAGNOSIS — M5416 Radiculopathy, lumbar region: Secondary | ICD-10-CM | POA: Diagnosis not present

## 2017-11-15 DIAGNOSIS — M545 Low back pain: Secondary | ICD-10-CM | POA: Diagnosis not present

## 2017-11-15 DIAGNOSIS — M961 Postlaminectomy syndrome, not elsewhere classified: Secondary | ICD-10-CM | POA: Diagnosis not present

## 2017-12-12 DIAGNOSIS — L570 Actinic keratosis: Secondary | ICD-10-CM | POA: Diagnosis not present

## 2018-02-18 DIAGNOSIS — Z6833 Body mass index (BMI) 33.0-33.9, adult: Secondary | ICD-10-CM | POA: Diagnosis not present

## 2018-02-18 DIAGNOSIS — M5416 Radiculopathy, lumbar region: Secondary | ICD-10-CM | POA: Diagnosis not present

## 2018-02-18 DIAGNOSIS — R030 Elevated blood-pressure reading, without diagnosis of hypertension: Secondary | ICD-10-CM | POA: Diagnosis not present

## 2018-02-18 DIAGNOSIS — M545 Low back pain: Secondary | ICD-10-CM | POA: Diagnosis not present

## 2018-02-18 DIAGNOSIS — G5601 Carpal tunnel syndrome, right upper limb: Secondary | ICD-10-CM | POA: Diagnosis not present

## 2018-05-08 DIAGNOSIS — Z6833 Body mass index (BMI) 33.0-33.9, adult: Secondary | ICD-10-CM | POA: Diagnosis not present

## 2018-05-08 DIAGNOSIS — M545 Low back pain: Secondary | ICD-10-CM | POA: Diagnosis not present

## 2018-05-08 DIAGNOSIS — M5416 Radiculopathy, lumbar region: Secondary | ICD-10-CM | POA: Diagnosis not present

## 2018-05-08 DIAGNOSIS — R030 Elevated blood-pressure reading, without diagnosis of hypertension: Secondary | ICD-10-CM | POA: Diagnosis not present

## 2018-06-11 DIAGNOSIS — J209 Acute bronchitis, unspecified: Secondary | ICD-10-CM | POA: Diagnosis not present

## 2018-07-29 DIAGNOSIS — Z6833 Body mass index (BMI) 33.0-33.9, adult: Secondary | ICD-10-CM | POA: Diagnosis not present

## 2018-07-29 DIAGNOSIS — R030 Elevated blood-pressure reading, without diagnosis of hypertension: Secondary | ICD-10-CM | POA: Diagnosis not present

## 2018-07-29 DIAGNOSIS — M5416 Radiculopathy, lumbar region: Secondary | ICD-10-CM | POA: Diagnosis not present

## 2018-07-29 DIAGNOSIS — M545 Low back pain: Secondary | ICD-10-CM | POA: Diagnosis not present

## 2018-08-27 DIAGNOSIS — N400 Enlarged prostate without lower urinary tract symptoms: Secondary | ICD-10-CM | POA: Diagnosis not present

## 2018-09-02 DIAGNOSIS — N5201 Erectile dysfunction due to arterial insufficiency: Secondary | ICD-10-CM | POA: Diagnosis not present

## 2018-09-02 DIAGNOSIS — R3912 Poor urinary stream: Secondary | ICD-10-CM | POA: Diagnosis not present

## 2018-09-02 DIAGNOSIS — N401 Enlarged prostate with lower urinary tract symptoms: Secondary | ICD-10-CM | POA: Diagnosis not present

## 2018-10-07 DIAGNOSIS — E785 Hyperlipidemia, unspecified: Secondary | ICD-10-CM | POA: Diagnosis not present

## 2018-10-07 DIAGNOSIS — N400 Enlarged prostate without lower urinary tract symptoms: Secondary | ICD-10-CM | POA: Diagnosis not present

## 2018-10-07 DIAGNOSIS — Z0000 Encounter for general adult medical examination without abnormal findings: Secondary | ICD-10-CM | POA: Diagnosis not present

## 2018-10-07 DIAGNOSIS — G4733 Obstructive sleep apnea (adult) (pediatric): Secondary | ICD-10-CM | POA: Diagnosis not present

## 2018-10-11 DIAGNOSIS — Z0000 Encounter for general adult medical examination without abnormal findings: Secondary | ICD-10-CM | POA: Diagnosis not present

## 2018-10-11 DIAGNOSIS — E785 Hyperlipidemia, unspecified: Secondary | ICD-10-CM | POA: Diagnosis not present

## 2018-10-11 DIAGNOSIS — M79643 Pain in unspecified hand: Secondary | ICD-10-CM | POA: Diagnosis not present

## 2018-10-11 DIAGNOSIS — G4733 Obstructive sleep apnea (adult) (pediatric): Secondary | ICD-10-CM | POA: Diagnosis not present

## 2018-10-11 DIAGNOSIS — M549 Dorsalgia, unspecified: Secondary | ICD-10-CM | POA: Diagnosis not present

## 2018-10-17 DIAGNOSIS — M961 Postlaminectomy syndrome, not elsewhere classified: Secondary | ICD-10-CM | POA: Diagnosis not present

## 2019-01-16 DIAGNOSIS — G5601 Carpal tunnel syndrome, right upper limb: Secondary | ICD-10-CM | POA: Diagnosis not present

## 2019-01-21 DIAGNOSIS — M961 Postlaminectomy syndrome, not elsewhere classified: Secondary | ICD-10-CM | POA: Diagnosis not present

## 2019-01-21 DIAGNOSIS — G5601 Carpal tunnel syndrome, right upper limb: Secondary | ICD-10-CM | POA: Diagnosis not present

## 2019-03-24 DIAGNOSIS — M961 Postlaminectomy syndrome, not elsewhere classified: Secondary | ICD-10-CM | POA: Diagnosis not present

## 2019-03-24 DIAGNOSIS — M546 Pain in thoracic spine: Secondary | ICD-10-CM | POA: Diagnosis not present

## 2019-03-24 DIAGNOSIS — G5601 Carpal tunnel syndrome, right upper limb: Secondary | ICD-10-CM | POA: Diagnosis not present

## 2019-03-24 DIAGNOSIS — G5603 Carpal tunnel syndrome, bilateral upper limbs: Secondary | ICD-10-CM | POA: Diagnosis not present

## 2019-04-18 DIAGNOSIS — Z885 Allergy status to narcotic agent status: Secondary | ICD-10-CM | POA: Diagnosis not present

## 2019-04-18 DIAGNOSIS — L239 Allergic contact dermatitis, unspecified cause: Secondary | ICD-10-CM | POA: Diagnosis not present

## 2019-04-23 ENCOUNTER — Ambulatory Visit
Admission: RE | Admit: 2019-04-23 | Discharge: 2019-04-23 | Disposition: A | Discharge status: Home / Self Care | Payer: Medicare Other | Source: Ambulatory Visit | Attending: Neurosurgery | Admitting: Neurosurgery

## 2019-04-23 ENCOUNTER — Other Ambulatory Visit: Payer: Self-pay | Admitting: Neurosurgery

## 2019-04-23 ENCOUNTER — Other Ambulatory Visit: Payer: Self-pay

## 2019-04-23 DIAGNOSIS — M542 Cervicalgia: Secondary | ICD-10-CM

## 2019-04-23 DIAGNOSIS — I10 Essential (primary) hypertension: Secondary | ICD-10-CM | POA: Diagnosis not present

## 2019-04-23 DIAGNOSIS — M961 Postlaminectomy syndrome, not elsewhere classified: Secondary | ICD-10-CM | POA: Diagnosis not present

## 2019-04-23 DIAGNOSIS — Z6833 Body mass index (BMI) 33.0-33.9, adult: Secondary | ICD-10-CM | POA: Diagnosis not present

## 2019-05-15 DIAGNOSIS — M5416 Radiculopathy, lumbar region: Secondary | ICD-10-CM | POA: Diagnosis not present

## 2019-05-15 DIAGNOSIS — R030 Elevated blood-pressure reading, without diagnosis of hypertension: Secondary | ICD-10-CM | POA: Diagnosis not present

## 2019-05-15 DIAGNOSIS — M542 Cervicalgia: Secondary | ICD-10-CM | POA: Diagnosis not present

## 2019-05-15 DIAGNOSIS — M961 Postlaminectomy syndrome, not elsewhere classified: Secondary | ICD-10-CM | POA: Diagnosis not present

## 2019-05-15 DIAGNOSIS — Z6832 Body mass index (BMI) 32.0-32.9, adult: Secondary | ICD-10-CM | POA: Diagnosis not present

## 2019-09-04 DIAGNOSIS — R350 Frequency of micturition: Secondary | ICD-10-CM | POA: Diagnosis not present

## 2019-09-04 DIAGNOSIS — R3915 Urgency of urination: Secondary | ICD-10-CM | POA: Diagnosis not present

## 2019-09-04 DIAGNOSIS — N401 Enlarged prostate with lower urinary tract symptoms: Secondary | ICD-10-CM | POA: Diagnosis not present

## 2019-09-05 DIAGNOSIS — H40013 Open angle with borderline findings, low risk, bilateral: Secondary | ICD-10-CM | POA: Diagnosis not present

## 2019-09-05 DIAGNOSIS — H25813 Combined forms of age-related cataract, bilateral: Secondary | ICD-10-CM | POA: Diagnosis not present

## 2019-09-23 DIAGNOSIS — G2581 Restless legs syndrome: Secondary | ICD-10-CM | POA: Diagnosis not present

## 2019-09-23 DIAGNOSIS — M5416 Radiculopathy, lumbar region: Secondary | ICD-10-CM | POA: Diagnosis not present

## 2019-09-23 DIAGNOSIS — M961 Postlaminectomy syndrome, not elsewhere classified: Secondary | ICD-10-CM | POA: Diagnosis not present

## 2019-10-08 DIAGNOSIS — G4733 Obstructive sleep apnea (adult) (pediatric): Secondary | ICD-10-CM | POA: Diagnosis not present

## 2019-10-14 DIAGNOSIS — R350 Frequency of micturition: Secondary | ICD-10-CM | POA: Diagnosis not present

## 2019-10-14 DIAGNOSIS — N401 Enlarged prostate with lower urinary tract symptoms: Secondary | ICD-10-CM | POA: Diagnosis not present

## 2019-10-14 DIAGNOSIS — R3915 Urgency of urination: Secondary | ICD-10-CM | POA: Diagnosis not present

## 2019-10-17 DIAGNOSIS — N400 Enlarged prostate without lower urinary tract symptoms: Secondary | ICD-10-CM | POA: Diagnosis not present

## 2019-10-17 DIAGNOSIS — Z79899 Other long term (current) drug therapy: Secondary | ICD-10-CM | POA: Diagnosis not present

## 2019-10-17 DIAGNOSIS — E7800 Pure hypercholesterolemia, unspecified: Secondary | ICD-10-CM | POA: Diagnosis not present

## 2019-10-17 DIAGNOSIS — R030 Elevated blood-pressure reading, without diagnosis of hypertension: Secondary | ICD-10-CM | POA: Diagnosis not present

## 2019-10-17 DIAGNOSIS — G4730 Sleep apnea, unspecified: Secondary | ICD-10-CM | POA: Diagnosis not present

## 2019-10-17 DIAGNOSIS — Z0001 Encounter for general adult medical examination with abnormal findings: Secondary | ICD-10-CM | POA: Diagnosis not present

## 2019-10-23 DIAGNOSIS — Z23 Encounter for immunization: Secondary | ICD-10-CM | POA: Diagnosis not present

## 2019-11-20 DIAGNOSIS — Z23 Encounter for immunization: Secondary | ICD-10-CM | POA: Diagnosis not present

## 2020-03-16 DIAGNOSIS — M47812 Spondylosis without myelopathy or radiculopathy, cervical region: Secondary | ICD-10-CM | POA: Diagnosis not present

## 2020-03-16 DIAGNOSIS — M961 Postlaminectomy syndrome, not elsewhere classified: Secondary | ICD-10-CM | POA: Diagnosis not present

## 2020-03-16 DIAGNOSIS — M5416 Radiculopathy, lumbar region: Secondary | ICD-10-CM | POA: Diagnosis not present

## 2020-04-23 DIAGNOSIS — J0110 Acute frontal sinusitis, unspecified: Secondary | ICD-10-CM | POA: Diagnosis not present

## 2020-06-17 DIAGNOSIS — N401 Enlarged prostate with lower urinary tract symptoms: Secondary | ICD-10-CM | POA: Diagnosis not present

## 2020-06-17 DIAGNOSIS — Z20822 Contact with and (suspected) exposure to covid-19: Secondary | ICD-10-CM | POA: Diagnosis not present

## 2020-06-17 DIAGNOSIS — R6510 Systemic inflammatory response syndrome (SIRS) of non-infectious origin without acute organ dysfunction: Secondary | ICD-10-CM | POA: Diagnosis not present

## 2020-06-17 DIAGNOSIS — N281 Cyst of kidney, acquired: Secondary | ICD-10-CM | POA: Diagnosis not present

## 2020-06-17 DIAGNOSIS — R338 Other retention of urine: Secondary | ICD-10-CM | POA: Diagnosis not present

## 2020-06-17 DIAGNOSIS — I700 Atherosclerosis of aorta: Secondary | ICD-10-CM | POA: Diagnosis not present

## 2020-06-17 DIAGNOSIS — A419 Sepsis, unspecified organism: Secondary | ICD-10-CM | POA: Diagnosis not present

## 2020-06-17 DIAGNOSIS — D72829 Elevated white blood cell count, unspecified: Secondary | ICD-10-CM | POA: Diagnosis not present

## 2020-06-17 DIAGNOSIS — R339 Retention of urine, unspecified: Secondary | ICD-10-CM | POA: Diagnosis not present

## 2020-06-17 DIAGNOSIS — N400 Enlarged prostate without lower urinary tract symptoms: Secondary | ICD-10-CM | POA: Diagnosis not present

## 2020-06-17 DIAGNOSIS — N12 Tubulo-interstitial nephritis, not specified as acute or chronic: Secondary | ICD-10-CM | POA: Diagnosis not present

## 2020-06-17 DIAGNOSIS — N390 Urinary tract infection, site not specified: Secondary | ICD-10-CM | POA: Diagnosis not present

## 2020-06-18 DIAGNOSIS — I700 Atherosclerosis of aorta: Secondary | ICD-10-CM | POA: Diagnosis not present

## 2020-06-18 DIAGNOSIS — R338 Other retention of urine: Secondary | ICD-10-CM | POA: Diagnosis present

## 2020-06-18 DIAGNOSIS — N400 Enlarged prostate without lower urinary tract symptoms: Secondary | ICD-10-CM | POA: Diagnosis not present

## 2020-06-18 DIAGNOSIS — N12 Tubulo-interstitial nephritis, not specified as acute or chronic: Secondary | ICD-10-CM | POA: Diagnosis not present

## 2020-06-18 DIAGNOSIS — N281 Cyst of kidney, acquired: Secondary | ICD-10-CM | POA: Diagnosis not present

## 2020-06-18 DIAGNOSIS — R339 Retention of urine, unspecified: Secondary | ICD-10-CM | POA: Diagnosis not present

## 2020-06-18 DIAGNOSIS — R6510 Systemic inflammatory response syndrome (SIRS) of non-infectious origin without acute organ dysfunction: Secondary | ICD-10-CM | POA: Diagnosis not present

## 2020-06-18 DIAGNOSIS — N39490 Overflow incontinence: Secondary | ICD-10-CM | POA: Diagnosis not present

## 2020-06-18 DIAGNOSIS — N401 Enlarged prostate with lower urinary tract symptoms: Secondary | ICD-10-CM | POA: Diagnosis not present

## 2020-06-18 DIAGNOSIS — A419 Sepsis, unspecified organism: Secondary | ICD-10-CM | POA: Diagnosis not present

## 2020-06-18 DIAGNOSIS — Z20822 Contact with and (suspected) exposure to covid-19: Secondary | ICD-10-CM | POA: Diagnosis present

## 2020-06-18 DIAGNOSIS — N390 Urinary tract infection, site not specified: Secondary | ICD-10-CM | POA: Diagnosis not present

## 2020-06-19 DIAGNOSIS — R339 Retention of urine, unspecified: Secondary | ICD-10-CM | POA: Diagnosis not present

## 2020-06-19 DIAGNOSIS — N390 Urinary tract infection, site not specified: Secondary | ICD-10-CM | POA: Diagnosis not present

## 2020-06-19 DIAGNOSIS — N39490 Overflow incontinence: Secondary | ICD-10-CM | POA: Diagnosis not present

## 2020-06-19 DIAGNOSIS — N401 Enlarged prostate with lower urinary tract symptoms: Secondary | ICD-10-CM | POA: Diagnosis not present

## 2020-06-22 DIAGNOSIS — N401 Enlarged prostate with lower urinary tract symptoms: Secondary | ICD-10-CM | POA: Diagnosis not present

## 2020-06-22 DIAGNOSIS — N3000 Acute cystitis without hematuria: Secondary | ICD-10-CM | POA: Diagnosis not present

## 2020-06-22 DIAGNOSIS — R338 Other retention of urine: Secondary | ICD-10-CM | POA: Diagnosis not present

## 2020-06-24 DIAGNOSIS — E785 Hyperlipidemia, unspecified: Secondary | ICD-10-CM | POA: Diagnosis not present

## 2020-06-24 DIAGNOSIS — Z885 Allergy status to narcotic agent status: Secondary | ICD-10-CM | POA: Diagnosis not present

## 2020-06-24 DIAGNOSIS — M47816 Spondylosis without myelopathy or radiculopathy, lumbar region: Secondary | ICD-10-CM | POA: Diagnosis not present

## 2020-06-24 DIAGNOSIS — Z87440 Personal history of urinary (tract) infections: Secondary | ICD-10-CM | POA: Diagnosis not present

## 2020-06-24 DIAGNOSIS — Z981 Arthrodesis status: Secondary | ICD-10-CM | POA: Diagnosis not present

## 2020-06-24 DIAGNOSIS — Z79899 Other long term (current) drug therapy: Secondary | ICD-10-CM | POA: Diagnosis not present

## 2020-06-24 DIAGNOSIS — K5900 Constipation, unspecified: Secondary | ICD-10-CM | POA: Diagnosis not present

## 2020-06-24 DIAGNOSIS — R339 Retention of urine, unspecified: Secondary | ICD-10-CM | POA: Diagnosis not present

## 2020-06-25 DIAGNOSIS — Z981 Arthrodesis status: Secondary | ICD-10-CM | POA: Diagnosis not present

## 2020-06-25 DIAGNOSIS — K5900 Constipation, unspecified: Secondary | ICD-10-CM | POA: Diagnosis not present

## 2020-06-25 DIAGNOSIS — M47816 Spondylosis without myelopathy or radiculopathy, lumbar region: Secondary | ICD-10-CM | POA: Diagnosis not present

## 2020-07-10 DIAGNOSIS — I10 Essential (primary) hypertension: Secondary | ICD-10-CM | POA: Diagnosis not present

## 2020-07-10 DIAGNOSIS — T83031A Leakage of indwelling urethral catheter, initial encounter: Secondary | ICD-10-CM | POA: Diagnosis not present

## 2020-07-10 DIAGNOSIS — E785 Hyperlipidemia, unspecified: Secondary | ICD-10-CM | POA: Diagnosis not present

## 2020-07-10 DIAGNOSIS — T83091A Other mechanical complication of indwelling urethral catheter, initial encounter: Secondary | ICD-10-CM | POA: Diagnosis not present

## 2020-07-23 DIAGNOSIS — N3000 Acute cystitis without hematuria: Secondary | ICD-10-CM | POA: Diagnosis not present

## 2020-07-23 DIAGNOSIS — N138 Other obstructive and reflux uropathy: Secondary | ICD-10-CM | POA: Diagnosis not present

## 2020-07-23 DIAGNOSIS — N401 Enlarged prostate with lower urinary tract symptoms: Secondary | ICD-10-CM | POA: Diagnosis not present

## 2020-07-28 DIAGNOSIS — N3000 Acute cystitis without hematuria: Secondary | ICD-10-CM | POA: Diagnosis not present

## 2020-08-03 DIAGNOSIS — N401 Enlarged prostate with lower urinary tract symptoms: Secondary | ICD-10-CM | POA: Diagnosis not present

## 2020-08-03 DIAGNOSIS — R3912 Poor urinary stream: Secondary | ICD-10-CM | POA: Diagnosis not present

## 2020-08-03 DIAGNOSIS — R3914 Feeling of incomplete bladder emptying: Secondary | ICD-10-CM | POA: Diagnosis not present

## 2020-08-25 DIAGNOSIS — N401 Enlarged prostate with lower urinary tract symptoms: Secondary | ICD-10-CM | POA: Diagnosis not present

## 2020-08-25 DIAGNOSIS — R3914 Feeling of incomplete bladder emptying: Secondary | ICD-10-CM | POA: Diagnosis not present

## 2020-08-25 DIAGNOSIS — Z125 Encounter for screening for malignant neoplasm of prostate: Secondary | ICD-10-CM | POA: Diagnosis not present

## 2020-08-30 ENCOUNTER — Other Ambulatory Visit: Payer: Self-pay | Admitting: Urology

## 2020-09-09 DIAGNOSIS — G2581 Restless legs syndrome: Secondary | ICD-10-CM | POA: Diagnosis not present

## 2020-09-09 DIAGNOSIS — M5416 Radiculopathy, lumbar region: Secondary | ICD-10-CM | POA: Diagnosis not present

## 2020-09-09 DIAGNOSIS — M961 Postlaminectomy syndrome, not elsewhere classified: Secondary | ICD-10-CM | POA: Diagnosis not present

## 2020-09-30 ENCOUNTER — Encounter (HOSPITAL_COMMUNITY): Admission: RE | Admit: 2020-09-30 | Payer: Medicare Other | Source: Ambulatory Visit

## 2020-09-30 ENCOUNTER — Other Ambulatory Visit (HOSPITAL_COMMUNITY): Payer: Medicare Other

## 2020-10-13 DIAGNOSIS — G4733 Obstructive sleep apnea (adult) (pediatric): Secondary | ICD-10-CM | POA: Diagnosis not present

## 2020-10-19 NOTE — Patient Instructions (Addendum)
DUE TO COVID-19 ONLY ONE VISITOR IS ALLOWED TO COME WITH YOU AND STAY IN THE WAITING ROOM ONLY DURING PRE OP AND PROCEDURE DAY OF SURGERY. THE 2 VISITORSBri  MAY VISIT WITH YOU AFTER SURGERY IN YOUR PRIVATE ROOM DURING VISITING HOURS ONLY!  YOU NEED TO HAVE A COVID 19 TEST ON_4/28______ @_12 :45______, THIS TEST MUST BE DONE BEFORE SURGERY,  COVID TESTING SITE [place] [id number], IT IS ON THE RIGHT GOING OUT [address] APPROXIMATELY  2 MINUTES PAST [employer] ON THE RIGHT. ONCE YOUR COVID TEST IS COMPLETED,  PLEASE BEGIN THE QUARANTINE INSTRUCTIONS AS OUTLINED IN YOUR HANDOUT.                Edison Nasuti    Your procedure is scheduled on: 10/25/20   Report to [facility] Main  Entrance   Report to admitting at  10:15 AM     Call this number if you have problems the morning of surgery [phone]    Remember: Do not eat food or drink liquids :After Midnight.   BRUSH YOUR TEETH MORNING OF SURGERY AND RINSE YOUR MOUTH OUT, NO CHEWING GUM CANDY OR MINTS.     Take these medicines the morning of surgery with A SIP OF WATER: Tamsulosin. Bring your mask and tubing to the hospital                                You may not have any metal on your body including               piercings  Do not wear jewelry, lotions, powders or deodorant              Men may shave face and neck.   Do not bring valuables to the hospital. [place].  Contacts, dentures or bridgework may not be worn into surgery.                   Please read over the following fact sheets you were given: _____________________________________________________________________             [facility] - Preparing for Surgery Before surgery, you can play an important role.  Because skin is not sterile, your skin needs to be as free of germs as possible.  You can reduce the number of germs on your skin by washing with CHG  (chlorahexidine gluconate) soap before surgery.  CHG is an antiseptic cleaner which kills germs and bonds with the skin to continue killing germs even after washing. Please DO NOT use if you have an allergy to CHG or antibacterial soaps.  If your skin becomes reddened/irritated stop using the CHG and inform your nurse when you arrive at Short Stay. You may shave your face/neck. Please follow these instructions carefully:  1.  Shower with CHG Soap the night before surgery and the  morning of Surgery.  2.  If you choose to wash your hair, wash your hair first as usual with your  normal  shampoo.  3.  After you shampoo, rinse your hair and body thoroughly to remove the  shampoo.  4.  Use CHG as you would any other liquid soap.  You can apply chg directly  to the skin and wash                       Gently with a scrungie or clean washcloth.  5.  Apply the CHG Soap to your body ONLY FROM THE NECK DOWN.   Do not use on face/ open                           Wound or open sores. Avoid contact with eyes, ears mouth and genitals (private parts).                       Wash face,  Genitals (private parts) with your normal soap.             6.  Wash thoroughly, paying special attention to the area where your surgery  will be performed.  7.  Thoroughly rinse your body with warm water from the neck down.  8.  DO NOT shower/wash with your normal soap after using and rinsing off  the CHG Soap.             9.  Pat yourself dry with a clean towel.            10.  Wear clean pajamas.            11.  Place clean sheets on your bed the night of your first shower and do not  sleep with pets. Day of Surgery : Do not apply any lotions/deodorants the morning of surgery.  Please wear clean clothes to the hospital/surgery center.  FAILURE TO FOLLOW THESE INSTRUCTIONS MAY RESULT IN THE CANCELLATION OF YOUR SURGERY PATIENT SIGNATURE_________________________________  NURSE  SIGNATURE__________________________________  ________________________________________________________________________

## 2020-10-21 ENCOUNTER — Encounter (HOSPITAL_COMMUNITY): Payer: Self-pay

## 2020-10-21 ENCOUNTER — Other Ambulatory Visit: Payer: Self-pay

## 2020-10-21 ENCOUNTER — Other Ambulatory Visit (HOSPITAL_COMMUNITY)
Admission: RE | Admit: 2020-10-21 | Discharge: 2020-10-21 | Disposition: A | Discharge status: Home / Self Care | Payer: Medicare Other | Source: Ambulatory Visit | Attending: Urology | Admitting: Urology

## 2020-10-21 ENCOUNTER — Encounter (HOSPITAL_COMMUNITY)
Admission: RE | Admit: 2020-10-21 | Discharge: 2020-10-21 | Disposition: A | Discharge status: Home / Self Care | Payer: Medicare Other | Source: Ambulatory Visit | Attending: Anesthesiology | Admitting: Anesthesiology

## 2020-10-21 DIAGNOSIS — Z20822 Contact with and (suspected) exposure to covid-19: Secondary | ICD-10-CM | POA: Insufficient documentation

## 2020-10-21 DIAGNOSIS — Z01812 Encounter for preprocedural laboratory examination: Secondary | ICD-10-CM | POA: Insufficient documentation

## 2020-10-21 HISTORY — DX: Myoneural disorder, unspecified: G70.9

## 2020-10-21 LAB — CBC
HCT: 39.6 % (ref 39.0–52.0)
Hemoglobin: 13.2 g/dL (ref 13.0–17.0)
MCH: 30.3 pg (ref 26.0–34.0)
MCHC: 33.3 g/dL (ref 30.0–36.0)
MCV: 91 fL (ref 80.0–100.0)
Platelets: 270 10*3/uL (ref 150–400)
RBC: 4.35 MIL/uL (ref 4.22–5.81)
RDW: 12.4 % (ref 11.5–15.5)
WBC: 6.6 10*3/uL (ref 4.0–10.5)
nRBC: 0 % (ref 0.0–0.2)

## 2020-10-21 LAB — SARS CORONAVIRUS 2 (TAT 6-24 HRS): SARS Coronavirus 2: NEGATIVE

## 2020-10-21 NOTE — Progress Notes (Signed)
COVID Vaccine Completed:Yes Date COVID Vaccine completed:03/2020 COVID vaccine manufacturer:   Moderna     PCP - Dr. Leia Alf Cardiologist - no  Chest x-ray - no EKG - no Stress Test - 04/24/17-epic for fatigue ECHO - no Cardiac Cath - no Pacemaker/ICD device last checked:NA  Sleep Study - yes CPAP -yes   Fasting Blood Sugar - NA Checks Blood Sugar _____ times a day  Blood Thinner Instructions:NA Aspirin Instructions: Last Dose:  Anesthesia review:   Patient denies shortness of breath, fever, cough and chest pain at PAT appointment Yes. Pt gets SOB walking up his hill but not doing work or with ADLs  Patient verbalized understanding of instructions that were given to them at the PAT appointment. Patient was also instructed that they will need to review over the PAT instructions again at home before surgery.Yes

## 2020-10-25 ENCOUNTER — Observation Stay (HOSPITAL_COMMUNITY)
Admission: RE | Admit: 2020-10-25 | Discharge: 2020-10-26 | Disposition: A | Discharge status: Home / Self Care | Payer: Medicare Other | Attending: Urology | Admitting: Urology

## 2020-10-25 ENCOUNTER — Ambulatory Visit (HOSPITAL_COMMUNITY): Payer: Medicare Other | Admitting: Registered Nurse

## 2020-10-25 ENCOUNTER — Other Ambulatory Visit: Payer: Self-pay

## 2020-10-25 ENCOUNTER — Encounter (HOSPITAL_COMMUNITY)
Admission: RE | Disposition: A | Discharge status: Home / Self Care | Payer: Self-pay | Source: Home / Self Care | Attending: Urology

## 2020-10-25 ENCOUNTER — Encounter (HOSPITAL_COMMUNITY): Payer: Self-pay | Admitting: Urology

## 2020-10-25 DIAGNOSIS — Z87891 Personal history of nicotine dependence: Secondary | ICD-10-CM | POA: Diagnosis not present

## 2020-10-25 DIAGNOSIS — R3914 Feeling of incomplete bladder emptying: Secondary | ICD-10-CM | POA: Insufficient documentation

## 2020-10-25 DIAGNOSIS — I493 Ventricular premature depolarization: Secondary | ICD-10-CM | POA: Diagnosis not present

## 2020-10-25 DIAGNOSIS — G4730 Sleep apnea, unspecified: Secondary | ICD-10-CM | POA: Diagnosis not present

## 2020-10-25 DIAGNOSIS — N138 Other obstructive and reflux uropathy: Secondary | ICD-10-CM | POA: Diagnosis not present

## 2020-10-25 DIAGNOSIS — N400 Enlarged prostate without lower urinary tract symptoms: Secondary | ICD-10-CM | POA: Diagnosis present

## 2020-10-25 DIAGNOSIS — N401 Enlarged prostate with lower urinary tract symptoms: Secondary | ICD-10-CM | POA: Diagnosis not present

## 2020-10-25 HISTORY — PX: HOLEP-LASER ENUCLEATION OF THE PROSTATE WITH MORCELLATION: SHX6641

## 2020-10-25 SURGERY — ENUCLEATION, PROSTATE, USING LASER, WITH MORCELLATION
Anesthesia: General

## 2020-10-25 MED ORDER — SODIUM CHLORIDE 0.9 % IR SOLN
3000.0000 mL | Status: DC
  Administered 2020-10-25 – 2020-10-26 (×2): 3000 mL

## 2020-10-25 MED ORDER — 0.9 % SODIUM CHLORIDE (POUR BTL) OPTIME
TOPICAL | Status: DC | PRN
  Administered 2020-10-25: 1000 mL

## 2020-10-25 MED ORDER — CEFAZOLIN SODIUM-DEXTROSE 2-4 GM/100ML-% IV SOLN
INTRAVENOUS | Status: AC
  Filled 2020-10-25: qty 100

## 2020-10-25 MED ORDER — ZOLPIDEM TARTRATE 5 MG PO TABS: 5.0000 mg | ORAL_TABLET | Freq: Every evening | ORAL | Status: DC | PRN

## 2020-10-25 MED ORDER — MORPHINE SULFATE (PF) 4 MG/ML IV SOLN
2.0000 mg | INTRAVENOUS | Status: DC | PRN
Start: 2020-10-25 — End: 2020-10-26

## 2020-10-25 MED ORDER — SODIUM CHLORIDE 0.9% FLUSH: 3.0000 mL | INTRAVENOUS | Status: DC | PRN

## 2020-10-25 MED ORDER — FENTANYL CITRATE (PF) 250 MCG/5ML IJ SOLN
INTRAMUSCULAR | Status: DC | PRN
  Administered 2020-10-25 (×2): 50 ug via INTRAVENOUS

## 2020-10-25 MED ORDER — ONDANSETRON HCL 4 MG/2ML IJ SOLN
INTRAMUSCULAR | Status: DC | PRN
  Administered 2020-10-25: 4 mg via INTRAVENOUS

## 2020-10-25 MED ORDER — LACTATED RINGERS IV SOLN: INTRAVENOUS | Status: DC

## 2020-10-25 MED ORDER — CEPHALEXIN 500 MG PO CAPS: 500.0000 mg | ORAL_CAPSULE | Freq: Two times a day (BID) | ORAL | 0 refills | Status: AC

## 2020-10-25 MED ORDER — CHLORHEXIDINE GLUCONATE 0.12 % MT SOLN
15.0000 mL | Freq: Once | OROMUCOSAL | Status: AC
  Administered 2020-10-25: 15 mL via OROMUCOSAL

## 2020-10-25 MED ORDER — FENTANYL CITRATE (PF) 100 MCG/2ML IJ SOLN
INTRAMUSCULAR | Status: AC
  Filled 2020-10-25: qty 2

## 2020-10-25 MED ORDER — ONDANSETRON HCL 4 MG/2ML IJ SOLN: 4.0000 mg | INTRAMUSCULAR | Status: DC | PRN

## 2020-10-25 MED ORDER — DOCUSATE SODIUM 100 MG PO CAPS
100.0000 mg | ORAL_CAPSULE | Freq: Two times a day (BID) | ORAL | Status: DC
  Administered 2020-10-25 – 2020-10-26 (×2): 100 mg via ORAL
  Filled 2020-10-25 (×2): qty 1

## 2020-10-25 MED ORDER — SODIUM CHLORIDE 0.9 % IR SOLN
Status: DC | PRN
  Administered 2020-10-25: 54000 mL
  Administered 2020-10-25 (×3): 3000 mL

## 2020-10-25 MED ORDER — DIPHENHYDRAMINE HCL 50 MG/ML IJ SOLN: 12.5000 mg | Freq: Four times a day (QID) | INTRAMUSCULAR | Status: DC | PRN

## 2020-10-25 MED ORDER — PROPOFOL 10 MG/ML IV BOLUS
INTRAVENOUS | Status: DC | PRN
  Administered 2020-10-25: 150 mg via INTRAVENOUS

## 2020-10-25 MED ORDER — HYDROCODONE-ACETAMINOPHEN 5-325 MG PO TABS
1.0000 | ORAL_TABLET | ORAL | Status: DC | PRN
Start: 2020-10-25 — End: 2020-10-26

## 2020-10-25 MED ORDER — ONDANSETRON HCL 4 MG/2ML IJ SOLN
INTRAMUSCULAR | Status: AC
  Filled 2020-10-25: qty 2

## 2020-10-25 MED ORDER — SODIUM CHLORIDE 0.9% FLUSH
3.0000 mL | Freq: Two times a day (BID) | INTRAVENOUS | Status: DC
  Administered 2020-10-26: 3 mL via INTRAVENOUS

## 2020-10-25 MED ORDER — MIDAZOLAM HCL 5 MG/5ML IJ SOLN
INTRAMUSCULAR | Status: DC | PRN
  Administered 2020-10-25: 2 mg via INTRAVENOUS

## 2020-10-25 MED ORDER — CEFAZOLIN SODIUM-DEXTROSE 2-4 GM/100ML-% IV SOLN
2.0000 g | Freq: Once | INTRAVENOUS | Status: AC
  Administered 2020-10-25: 2 g via INTRAVENOUS

## 2020-10-25 MED ORDER — ACETAMINOPHEN 325 MG PO TABS: 650.0000 mg | ORAL_TABLET | ORAL | Status: DC | PRN

## 2020-10-25 MED ORDER — DEXAMETHASONE SODIUM PHOSPHATE 10 MG/ML IJ SOLN
INTRAMUSCULAR | Status: DC | PRN
  Administered 2020-10-25: 10 mg via INTRAVENOUS

## 2020-10-25 MED ORDER — MIDAZOLAM HCL 2 MG/2ML IJ SOLN
INTRAMUSCULAR | Status: AC
  Filled 2020-10-25: qty 2

## 2020-10-25 MED ORDER — LIDOCAINE 2% (20 MG/ML) 5 ML SYRINGE
INTRAMUSCULAR | Status: DC | PRN
  Administered 2020-10-25: 60 mg via INTRAVENOUS

## 2020-10-25 MED ORDER — DIPHENHYDRAMINE HCL 12.5 MG/5ML PO ELIX: 12.5000 mg | ORAL_SOLUTION | Freq: Four times a day (QID) | ORAL | Status: DC | PRN

## 2020-10-25 MED ORDER — BELLADONNA ALKALOIDS-OPIUM 16.2-60 MG RE SUPP: 1.0000 | Freq: Four times a day (QID) | RECTAL | Status: DC | PRN

## 2020-10-25 MED ORDER — DEXAMETHASONE SODIUM PHOSPHATE 10 MG/ML IJ SOLN
INTRAMUSCULAR | Status: AC
  Filled 2020-10-25: qty 1

## 2020-10-25 MED ORDER — SODIUM CHLORIDE 0.9 % IV SOLN: 250.0000 mL | INTRAVENOUS | Status: DC | PRN

## 2020-10-25 MED ORDER — SUGAMMADEX SODIUM 200 MG/2ML IV SOLN
INTRAVENOUS | Status: DC | PRN
  Administered 2020-10-25: 200 mg via INTRAVENOUS

## 2020-10-25 MED ORDER — BACITRACIN-NEOMYCIN-POLYMYXIN 400-5-5000 EX OINT: 1.0000 "application " | TOPICAL_OINTMENT | Freq: Three times a day (TID) | CUTANEOUS | Status: DC | PRN

## 2020-10-25 MED ORDER — TIZANIDINE HCL 2 MG PO TABS
2.0000 mg | ORAL_TABLET | Freq: Every day | ORAL | Status: DC
  Administered 2020-10-25: 2 mg via ORAL
  Filled 2020-10-25: qty 1

## 2020-10-25 MED ORDER — TRANEXAMIC ACID-NACL 1000-0.7 MG/100ML-% IV SOLN
INTRAVENOUS | Status: DC | PRN
  Administered 2020-10-25: 1000 mg via INTRAVENOUS

## 2020-10-25 MED ORDER — OXYBUTYNIN CHLORIDE ER 10 MG PO TB24
10.0000 mg | ORAL_TABLET | Freq: Every day | ORAL | Status: DC
  Administered 2020-10-25 – 2020-10-26 (×2): 10 mg via ORAL
  Filled 2020-10-25 (×2): qty 1

## 2020-10-25 MED ORDER — EPHEDRINE 5 MG/ML INJ
INTRAVENOUS | Status: AC
  Filled 2020-10-25: qty 10

## 2020-10-25 MED ORDER — STERILE WATER FOR IRRIGATION IR SOLN
Status: DC | PRN
  Administered 2020-10-25: 500 mL

## 2020-10-25 MED ORDER — TRANEXAMIC ACID-NACL 1000-0.7 MG/100ML-% IV SOLN
INTRAVENOUS | Status: AC
  Filled 2020-10-25: qty 100

## 2020-10-25 MED ORDER — ROCURONIUM BROMIDE 10 MG/ML (PF) SYRINGE
PREFILLED_SYRINGE | INTRAVENOUS | Status: AC
  Filled 2020-10-25: qty 10

## 2020-10-25 MED ORDER — HYDROCODONE-ACETAMINOPHEN 10-325 MG PO TABS: 1.0000 | ORAL_TABLET | Freq: Two times a day (BID) | ORAL | 0 refills | Status: AC | PRN

## 2020-10-25 MED ORDER — ORAL CARE MOUTH RINSE: 15.0000 mL | Freq: Once | OROMUCOSAL | Status: AC

## 2020-10-25 MED ORDER — ROCURONIUM BROMIDE 10 MG/ML (PF) SYRINGE
PREFILLED_SYRINGE | INTRAVENOUS | Status: DC | PRN
  Administered 2020-10-25: 60 mg via INTRAVENOUS

## 2020-10-25 MED ORDER — POTASSIUM CHLORIDE IN NACL 20-0.45 MEQ/L-% IV SOLN
INTRAVENOUS | Status: DC
  Filled 2020-10-25 (×3): qty 1000

## 2020-10-25 MED ORDER — EPHEDRINE SULFATE-NACL 50-0.9 MG/10ML-% IV SOSY
PREFILLED_SYRINGE | INTRAVENOUS | Status: DC | PRN
  Administered 2020-10-25 (×2): 10 mg via INTRAVENOUS

## 2020-10-25 MED ORDER — DOCUSATE SODIUM 100 MG PO CAPS: 100.0000 mg | ORAL_CAPSULE | Freq: Every day | ORAL | 0 refills | Status: DC | PRN

## 2020-10-25 MED ORDER — SUCCINYLCHOLINE CHLORIDE 200 MG/10ML IV SOSY
PREFILLED_SYRINGE | INTRAVENOUS | Status: DC | PRN
  Administered 2020-10-25: 140 mg via INTRAVENOUS

## 2020-10-25 MED ORDER — SUCCINYLCHOLINE CHLORIDE 200 MG/10ML IV SOSY
PREFILLED_SYRINGE | INTRAVENOUS | Status: AC
  Filled 2020-10-25: qty 10

## 2020-10-25 SURGICAL SUPPLY — 34 items
ADAPTER IRRIG TUBE 2 SPIKE SOL (ADAPTER) ×2 IMPLANT
BAG URINE DRAIN 2000ML AR STRL (UROLOGICAL SUPPLIES) ×2 IMPLANT
BAG URO DRAIN 4000ML (MISCELLANEOUS) ×2 IMPLANT
CATH FOLEY 3WAY 30CC 22FR (CATHETERS) ×2 IMPLANT
CATH FOLEY 3WAY 30CC 24FR (CATHETERS)
CATH URETL 5X70 OPEN END (CATHETERS) ×2 IMPLANT
CATH URTH STD 24FR FL 3W 2 (CATHETERS) IMPLANT
CONTAINER COLLECT MORCELLATR (MISCELLANEOUS) ×1 IMPLANT
DRAPE UTILITY 15X26 TOWEL STRL (DRAPES) IMPLANT
ELECT BIVAP BIPO 22/24 DONUT (ELECTROSURGICAL)
ELECT COAG BIPOLAR CYL 1.2MMM (ELECTROSURGICAL) ×2
ELECTRD BIVAP BIPO 22/24 DONUT (ELECTROSURGICAL) IMPLANT
ELECTRODE COAG BIPLR CYL 1.2MM (ELECTROSURGICAL) ×1 IMPLANT
FIBER LASER MOSES 550 DFL (Laser) ×2 IMPLANT
FILTER OVERFLOW MORCELLATOR (FILTER) ×1 IMPLANT
GLOVE SURG ENC TEXT LTX SZ7 (GLOVE) ×2 IMPLANT
GOWN STRL REUS W/TWL LRG LVL3 (GOWN DISPOSABLE) ×2 IMPLANT
HOLDER FOLEY CATH W/STRAP (MISCELLANEOUS) ×2 IMPLANT
KIT TURNOVER KIT A (KITS) ×2 IMPLANT
LOOP CUT BIPOLAR 24F LRG (ELECTROSURGICAL) ×2 IMPLANT
MBRN O SEALING YLW 17 FOR INST (MISCELLANEOUS) ×2
MEMBRANE SLNG YLW 17 FOR INST (MISCELLANEOUS) ×1 IMPLANT
MORCELLATOR COLLECT CONTAINER (MISCELLANEOUS) ×2
MORCELLATOR OVERFLOW FILTER (FILTER) ×2
MORCELLATOR ROTATION 4.75 335 (MISCELLANEOUS) ×2 IMPLANT
PACK CYSTO (CUSTOM PROCEDURE TRAY) ×2 IMPLANT
SET IRRIG Y TYPE TUR BLADDER L (SET/KITS/TRAYS/PACK) IMPLANT
SLEEVE SURGEON STRL (DRAPES) ×4 IMPLANT
SYR 30ML LL (SYRINGE) ×2 IMPLANT
SYR TOOMEY IRRIG 70ML (MISCELLANEOUS) ×2
SYRINGE TOOMEY IRRIG 70ML (MISCELLANEOUS) ×1 IMPLANT
TUBE PUMP MORCELLATOR PIRANHA (TUBING) ×2 IMPLANT
TUBING UROLOGY SET (TUBING) ×2 IMPLANT
WATER STERILE IRR 1000ML POUR (IV SOLUTION) ×2 IMPLANT

## 2020-10-25 NOTE — H&P (Signed)
CC/HPI: Juan Cooper is a 75 year old male seen in follow-up with BPH with LUTS for consideration of HoLEP.   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 He did ultimately pass a void trial on 07/23/2020. His PVR ranges in the 50-60 mL. He did not complete an IPSS evaluation as he states that he has been catheterizing intermittently for several years. He does report to me a very weak flow of stream. He denies bothersome frequency or urgency. He does at times have a sensation of incomplete bladder emptying. He undergoes CIC approximately 1-2 times a day. He continues on tamsulosin 0.8 mg daily. He denies taking 5 alpha reductase inhibitor. His last PSA on 08/28/2019 was normal at 3. He denies a family history of prostate cancer. He does note some gross hematuria intermittently with passing his catheter. He denies recurrent urinary tract infections.   TRUS ultrasound 08/25/2020 with 100 g prostate. Cystoscopy 08/25/2020 with by lobar obstructing kissing lateral lobes. Uroflow inaccurate.   He denies other significant medical problems. He denies any anticoagulation. He has had back surgery but no abdominal surgeries.     ALLERGIES: Codeine Derivatives    MEDICATIONS: Tamsulosin Hcl 0.4 mg capsule 2 capsule PO Daily  Atorvastatin Calcium 20 mg tablet  Celecoxib 200 mg capsule  Hydrocodone-Acetaminophen  Tizanidine Hcl 2 mg capsule     GU PSH: Vasectomy - 2010       Warrior Notes: Back Surgery, Surgery Of Male Genitalia Vasectomy   NON-GU PSH: None   GU PMH: BPH w/LUTS - 08/03/2020, - 07/23/2020, - 06/22/2020, - 2019, - 2019, Benign prostatic hyperplasia with urinary obstruction, - 2017 Encounter for Prostate Cancer screening (Stable) - 08/03/2020, - 09/02/2018 Incomplete bladder emptying - 08/03/2020 Weak Urinary Stream (Stable) - 08/03/2020, Weak urinary stream, - 2017 Acute Cystitis/UTI - 07/28/2020, - 07/23/2020, -  06/22/2020 Urinary Obstruction (Stable) - 07/23/2020, - 2019 Urinary Retention - 06/22/2020 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 Urinary Frequency - 2018 Urinary Urgency - 2018 Dysuria, Dysuria - 2016 Gross hematuria, Gross hematuria - 2016 Abdominal Pain Unspec, Acute right flank pain - 2016 Renal cyst, Renal cyst, acquired - 2016 Urinary Retention, Unspec, Incomplete bladder emptying - 2014      PMH Notes:  2008-09-18 11:08:41 - Note: Arthritis   NON-GU PMH: Encounter for general adult medical examination without abnormal findings, Encounter for preventive health examination - 2016 Personal history of other endocrine, nutritional and metabolic disease, History of hypercholesterolemia - 2016 Personal history of other diseases of the nervous system and sense organs, History of sleep apnea - 2014    FAMILY HISTORY: Blood In Urine - Father Family Health Status Number - Mother Father Deceased At Age26 ___ - Mother Heart Disease - Father Mother Deceased At Age 34 from diabetic complicati - Runs In Family nephrolithiasis - Father, Mother ovarian cancer - Mother   SOCIAL HISTORY: Marital Status: Married Preferred Language: English; Ethnicity: Not Hispanic Or Latino; Race: White Current Smoking Status: Patient does not smoke anymore. Has not smoked since 10/25/1966.   Tobacco Use Assessment Completed: Used Tobacco in last 30 days? Has never drank.  Drinks 1 caffeinated drink per day.     Notes: Number of children, Retired, Never A Smoker, Marital History - Currently Married, Tobacco Use, Alcohol Use, Caffeine Use   REVIEW OF SYSTEMS:    GU Review Male:   Patient denies burning/ pain with urination, leakage of urine, hard to  postpone urination, stream starts and stops, trouble starting your stream, erection problems, get up at night to urinate, penile pain, frequent urination, and have to strain to urinate .  Gastrointestinal (Upper):   Patient denies nausea, vomiting, and  indigestion/ heartburn.  Gastrointestinal (Lower):   Patient denies diarrhea and constipation.  Constitutional:   Patient denies fever, night sweats, weight loss, and fatigue.  Skin:   Patient denies skin rash/ lesion and itching.  Eyes:   Patient denies blurred vision and double vision.  Ears/ Nose/ Throat:   Patient denies sore throat and sinus problems.  Hematologic/Lymphatic:   Patient denies swollen glands and easy bruising.  Cardiovascular:   Patient denies leg swelling and chest pains.  Respiratory:   Patient denies cough and shortness of breath.  Endocrine:   Patient denies excessive thirst.  Musculoskeletal:   Patient denies back pain and joint pain.  Neurological:   Patient denies headaches and dizziness.  Psychologic:   Patient denies depression and anxiety.   VITAL SIGNS:      08/25/2020 11:01 AM  Weight 210 lb / 95.25 kg  Height 69 in / 175.26 cm  BP 147/83 mmHg  Pulse 84 /min  Temperature 98.0 F / 36.6 C  BMI 31.0 kg/m   MULTI-SYSTEM PHYSICAL EXAMINATION:    Constitutional: Well-nourished. No physical deformities. Normally developed. Good grooming.  Respiratory: No labored breathing, no use of accessory muscles.   Cardiovascular: Normal temperature, normal extremity pulses, no swelling, no varicosities.  Gastrointestinal: No mass, no tenderness, no rigidity, non obese abdomen.     Complexity of Data:  Source Of History:  Patient, Medical Record Summary  Lab Test Review:   PSA  Records Review:   AUA Symptom Score, Previous Doctor Records  Urine Test Review:   Urinalysis  Urodynamics Review:   Review Bladder Scan  X-Ray Review: Prostate Ultrasound: Reviewed Films. Reviewed Report. Discussed With Patient.     08/28/19 08/27/18 08/21/17 11/06/16 06/07/11 09/18/08  PSA  Total PSA 3.02 ng/mL 2.43 ng/mL 2.55 ng/mL 2.51 ng/dl 3.26  2.39     PROCEDURES:         Flexible Cystoscopy - 52000  Risks, benefits, and some of the potential complications of the procedure  were discussed at length with the patient including infection, bleeding, voiding discomfort, urinary retention, fever, chills, sepsis, and others. All questions were answered. Informed consent was obtained. Antibiotic prophylaxis was given. Sterile technique and intraurethral analgesia were used.  Meatus:  Normal size. Normal location. Normal condition.  Urethra:  No strictures.  External Sphincter:  Normal.  Verumontanum:  Normal.  Prostate:  Obstructing bilateral kissing lateral lobes. Elongated.  Bladder Neck:  Non-obstructing.  Ureteral Orifices:  Normal location. Normal size. Normal shape. Effluxed clear urine.  Bladder:  No trabeculation. No tumors. Normal mucosa. No stones.      The lower urinary tract was carefully examined. The procedure was well-tolerated and without complications. Antibiotic instructions were given. Instructions were given to call the office immediately for bloody urine, difficulty urinating, urinary retention, painful or frequent urination, fever, chills, nausea, vomiting or other illness. The patient stated that he understood these instructions and would comply with them.          Prostate Ultrasound - 33354  Length:6.4 cm Height:5.2 cm Width:5.7 cm Volume:100 ml  Prostate:  Enlarged gland w several midline cystic areas as well as a .75 cm hypoechoic LT base focal area  Prostate:  Enlarged gland w several midline cystic areas as well as a .  75 cm hypoechoic LT base focal area      The transrectal ultrasound probe is introduced into the rectum, and the prostate is visualized. Ultrasonography is utilized throughout the procedure. At the conclusion of the procedure, the ultrasound probe is removed. The patient tolerates the procedure without complication. . Patient confirmed No Neulasta OnPro Device.          Flow Rate - 51741  Time of Peak Flow: 0:01 min:sec  Notes: inaccurate   Flow Time: 0:02 min:sec  Peak Flow Rate: 23 cc/sec  Voided Volume: 1 cc  Total  Void Time: 0:02 min:sec           PVR Ultrasound - 37169  Scanned Volume: 59 cc         Urinalysis w/Scope Dipstick Dipstick Cont'd Micro  Color: Yellow Bilirubin: Neg mg/dL WBC/hpf: 6 - 10/hpf  Appearance: Slightly Cloudy Ketones: Neg mg/dL RBC/hpf: 0 - 2/hpf  Specific Gravity: 1.025 Blood: Neg ery/uL Bacteria: Few (10-25/hpf)  pH: 6.0 Protein: Neg mg/dL Cystals: NS (Not Seen)  Glucose: Neg mg/dL Urobilinogen: 0.2 mg/dL Casts: NS (Not Seen)    Nitrites: Neg Trichomonas: Not Present    Leukocyte Esterase: Trace leu/uL Mucous: Present      Epithelial Cells: 0 - 5/hpf      Yeast: NS (Not Seen)      Sperm: Not Present    ASSESSMENT:      ICD-10 Details  1 GU:   BPH w/LUTS - N40.1   2   Encounter for Prostate Cancer screening - Z12.5   3   Incomplete bladder emptying - R39.14    PLAN:           Document Letter(s):  Created for Patient: Clinical Summary         Notes:   #1. BPH with LUTS: TRUS volume 100 g. Cystoscopy with kissing obstructing lateral lobes. History of urinary retention and incomplete bladder emptying. He continues to use CIC 1-2 times daily. He continues on tamsulosin 0.8 mg. I discussed options for surgical intervention including HoLEP. He has done research online and watch videos and is convinced that this is the best option for him. He elects to proceed. Surgery letter sent.   Risks and benefits of Holmium Laser Enucleation of the Prostate were reviewed in detail including infection, bleeding, blood transfusion, injury to bladder/urethra/surrounding structures, erectile dysfunction, urinary incontinence, bladder neck contracture, persistent obstructive and irritative voiding symptoms, and global anesthesia risks including but not limited to CVA, MI, DVT, PE, pneumonia, and death. He expressed understanding and desire to proceed.   2. Prostate cancer screening: Last PSA on 08/27/2020 was normal at 3. DRE today at least 80 g, no nodules. PSA was ordered however he did  not obtain. I discussed the possibility of obtaining prostate cancer in the specimen with surgery for BOO.   Urology Preoperative H&P   Chief Complaint: BPH  History of Present Illness: Juan Cooper is a 75 y.o. male with BPH here for HoLEP with tissue morcellation. Denies fevers, chills.   Past Medical History:  Diagnosis Date  . Back pain   . Neuromuscular disorder ([place])    peripheral neuropathy  . Sleep apnea    No CPAP    Past Surgical History:  Procedure Laterality Date  . LUMBAR SPINE SURGERY  2011    Allergies:  Allergies  Allergen Reactions  . Codeine Other (See Comments)    Irritable     Family History  Problem Relation Age of Onset  .  Ovarian cancer Mother   . Heart attack Father 68       CABG  . Hyperlipidemia Father   . Deep vein thrombosis Father        PE, chronic warfarin age 7s on  . Stroke Sister     Social History:  reports that he quit smoking about 55 years ago. His smoking use included cigarettes. He has a 1.00 pack-year smoking history. He has never used smokeless tobacco. He reports that he does not drink alcohol and does not use drugs.  ROS: A complete review of systems was performed.  All systems are negative except for pertinent findings as noted.  Physical Exam:  Vital signs in last 24 hours: Temp:  [98.5 F (36.9 C)] 98.5 F (36.9 C) (05/02 1046) Pulse Rate:  [96] 96 (05/02 1046) Resp:  [17] 17 (05/02 1046) BP: (161)/(87) 161/87 (05/02 1046) SpO2:  [97 %] 97 % (05/02 1046) Weight:  [96.6 kg] 96.6 kg (05/02 1039) Constitutional:  Alert and oriented, No acute distress Cardiovascular: Regular rate and rhythm Respiratory: Normal respiratory effort, Lungs clear bilaterally GI: Abdomen is soft, nontender, nondistended, no abdominal masses GU: No CVA tenderness Lymphatic: No lymphadenopathy Neurologic: Grossly intact, no focal deficits Psychiatric: Normal mood and affect  Laboratory Data:  No results for input(s): WBC, HGB,  HCT, PLT in the last 72 hours.  No results for input(s): NA, K, CL, GLUCOSE, BUN, CALCIUM, CREATININE in the last 72 hours.  Invalid input(s): CO3   No results found for this or any previous visit (from the past 24 hour(s)). Recent Results (from the past 240 hour(s))  SARS CORONAVIRUS 2 (TAT 6-24 HRS) Nasopharyngeal Nasopharyngeal Swab     Status: None   Collection Time: 10/21/20 12:10 PM   Specimen: Nasopharyngeal Swab  Result Value Ref Range Status   SARS Coronavirus 2 NEGATIVE NEGATIVE Final    Comment: (NOTE) SARS-CoV-2 target nucleic acids are NOT DETECTED.  The SARS-CoV-2 RNA is generally detectable in upper and lower respiratory specimens during the acute phase of infection. Negative results do not preclude SARS-CoV-2 infection, do not rule out co-infections with other pathogens, and should not be used as the sole basis for treatment or other patient management decisions. Negative results must be combined with clinical observations, patient history, and epidemiological information. The expected result is Negative.  Fact Sheet for Patients: SugarRoll.be  Fact Sheet for Healthcare Providers: https://www.woods-mathews.com/  This test is not yet approved or cleared by the [place] FDA and  has been authorized for detection and/or diagnosis of SARS-CoV-2 by FDA under an Emergency Use Authorization (EUA). This EUA will remain  in effect (meaning this test can be used) for the duration of the COVID-19 declaration under Se ction 564(b)(1) of the Act, 21 U.S.C. section 360bbb-3(b)(1), unless the authorization is terminated or revoked sooner.  Performed at [facility], [place] [address] [place], [place] [address]     Renal Function: No results for input(s): CREATININE in the last 168 hours. CrCl cannot be calculated (Patient's most recent lab result is older than the maximum 21 days allowed.).  Radiologic Imaging: No  results found.  I independently reviewed the above imaging studies.  Assessment and Plan Juan Cooper is a 75 y.o. male with BPH here for HoLEP with tissue morcellation.  Risks and benefits of Holmium Laser Enucleation of the Prostate were reviewed in detail including infection, bleeding, blood transfusion, injury to bladder/urethra/surrounding structures, erectile dysfunction, urinary incontinence, bladder neck contracture, persistent obstructive and  irritative voiding symptoms, and global anesthesia risks including but not limited to CVA, MI, DVT, PE, pneumonia, and death. He expressed understanding and desire to proceed.   Matt R. Neidy Guerrieri MD 10/25/2020, 10:49 AM  [facility] Pager: [phone]): [phone]

## 2020-10-25 NOTE — Anesthesia Preprocedure Evaluation (Addendum)
Anesthesia Evaluation  Patient identified by MRN, date of birth, ID band Patient awake    Reviewed: Allergy & Precautions, NPO status   Airway Mallampati: II  TM Distance: >3 FB     Dental   Pulmonary sleep apnea , former smoker,    breath sounds clear to auscultation       Cardiovascular negative cardio ROS   Rhythm:Regular Rate:Normal     Neuro/Psych  Neuromuscular disease    GI/Hepatic Neg liver ROS,   Endo/Other  negative endocrine ROS  Renal/GU negative Renal ROS     Musculoskeletal   Abdominal   Peds  Hematology negative hematology ROS (+)   Anesthesia Other Findings   Reproductive/Obstetrics                             Anesthesia Physical Anesthesia Plan  ASA: III  Anesthesia Plan: General   Post-op Pain Management:    Induction: Intravenous  PONV Risk Score and Plan: 3 and Ondansetron, Propofol infusion and Midazolam  Airway Management Planned: Oral ETT  Additional Equipment:   Intra-op Plan:   Post-operative Plan: Extubation in OR  Informed Consent: I have reviewed the patients History and Physical, chart, labs and discussed the procedure including the risks, benefits and alternatives for the proposed anesthesia with the patient or authorized representative who has indicated his/her understanding and acceptance.     Dental advisory given  Plan Discussed with: CRNA and Anesthesiologist  Anesthesia Plan Comments:        Anesthesia Quick Evaluation

## 2020-10-25 NOTE — Progress Notes (Signed)
Patient updated that the OR is delayed,  But he should go back soon, within next 30 minutes. Patient voiced understanding.

## 2020-10-25 NOTE — Anesthesia Postprocedure Evaluation (Signed)
Anesthesia Post Note  Patient: Juan Cooper  Procedure(s) Performed: ATTEMPTED HOLMIUM ENUCLEATION OF THE PROSTATE WITH MORCELLATION.  CONVERTED TO TURP (N/A )     Patient location during evaluation: PACU Anesthesia Type: General Level of consciousness: awake Pain management: pain level controlled Respiratory status: spontaneous breathing Cardiovascular status: stable Postop Assessment: no apparent nausea or vomiting Anesthetic complications: no   No complications documented.  Last Vitals:  Vitals:   10/25/20 1615 10/25/20 1630  BP: (!) 148/76 (!) 146/81  Pulse: 72 72  Resp: (!) 22 14  Temp:    SpO2: 100% 100%    Last Pain:  Vitals:   10/25/20 1630  TempSrc:   PainSc: 0-No pain                 Katerina Zurn

## 2020-10-25 NOTE — Discharge Instructions (Signed)

## 2020-10-25 NOTE — Anesthesia Procedure Notes (Signed)
Procedure Name: Intubation Date/Time: 10/25/2020 1:33 PM Performed by: Talbot Grumbling, CRNA Pre-anesthesia Checklist: Patient identified, Emergency Drugs available, Suction available and Patient being monitored Patient Re-evaluated:Patient Re-evaluated prior to induction Oxygen Delivery Method: Circle system utilized Preoxygenation: Pre-oxygenation with 100% oxygen Induction Type: IV induction Ventilation: Mask ventilation without difficulty Laryngoscope Size: Mac and 3 Grade View: Grade II Tube type: Oral Tube size: 7.5 mm Number of attempts: 1 Airway Equipment and Method: Stylet Placement Confirmation: ETT inserted through vocal cords under direct vision,  positive ETCO2 and breath sounds checked- equal and bilateral Secured at: 22 cm Tube secured with: Tape Dental Injury: Teeth and Oropharynx as per pre-operative assessment

## 2020-10-25 NOTE — Op Note (Signed)
Operative Note  Preoperative diagnosis:  1.  Bladder outlet obstruction. 2.  Benign prostatic hyperplasia.  Postoperative diagnosis: 1.  Bladder outlet obstruction. 2.  Benign prostatic hyperplasia.  Procedure(s): 1.  Cystoscopy 2.  Transurethral resection of prostate  Surgeon: Rexene Alberts, MD  Assistants:  None  Anesthesia:  General  Complications:  None  EBL:  Minimal  Specimens: 1. Prostate chips ID Type Source Tests Collected by Time Destination  1 : Prostate chips Tissue PATH Prostate TURP SURGICAL PATHOLOGY Janith Lima, MD 10/25/2020 1420    Drains/Catheters: 1.  22-French 3-way Foley catheter  Intraoperative findings:   1.  No bladder lesions. 2.  Ureteral orifices in orthotopic position. 3.  Plus +2 trabeculation.  Indication:  Juan Cooper is a 75 y.o. male with a history of bladder outlet obstruction and benign prostatic hyperplasia.  Risks, benefits and alternatives were explained and the patient decided to proceed.  Description of procedure: The indications, alternatives, benefits and risks were discussed with the patient and informed consent was obtained.  The patient was brought onto the operating room table, positioned supine and secured with a safety strap.  Pneumatic compression devices were placed on the lower extremities.  After administration of intravenous antibiotics and general anesthesia, the patient was repositioned in the dorsal lithotomy position and all pressure points were carefully padded.  The genitalia were prepped and draped in the standard sterile manner.  Timeout was completed, verifying the correct patient, surgical procedure, and positioning prior to beginning the procedure.  Isotonic normal saline was used for irrigation.  The patient's urethra was calibrated to 30 [place] with sequential [employer] sounds.  Next, a 18 French continuous-flow resectoscope was inserted into the patient's bladder using the visual obturator.  This was  then exchanged for the laser bridge.  On cystoscopic evaluation, there were no tumors, stones or foreign bodies or diverticula present.  The bladder wall appeared trabeculated.  Both orifices were in the normal anatomic position with clear urinary reflux noted bilaterally.  The location of the ureteral orifices and prostatic configuration was again confirmed.  Using the 550 m holmium Moses laser fiber, we first made an incision starting above the verumontanum taking this down to the capsule.  We then made an incision starting at the bladder neck at 5 and 7 o'clock, taking these down to the level of the verumontanum.  We then proceeded to start the enucleation of the median lobe as well as undermine both lateral lobes in a retrograde fashion. Unfortunately, there was bleeding that was unable to be stopped with the holmium laser. Therefore, I elected to convert to a transurethral resection of the prostate.  The obturator was removed and replaced by the working element with a resection loop.  The location of the ureteral orifices and the prostatic configuration were again confirmed.  Starting at the bladder neck and proceeding distally to the verumontanum a transurethral section of the prostate was performed using bipolar using energy of 4 and 5 for cutting and coagulation, respectively.  The procedure began at the bladder neck at the 5 o'clock and 7 o'clock positions and carefully carried distally to the verumontanum, resecting the intervening prostatic adenoma.  Next the left lateral lobe was resected. The identical procedure was performed on the right lobe.  Attention was then directed anteriorly and the resection was completed from the 10 o'clock to 2 o'clock positions.  All bleeding vessels were fulgurated achieving meticulous hemostasis.  The bladder was irrigated with a To[place]y syringe,  ensuring removal of all prostate chips which were sent to pathology for evaluation.  Having completed the resection and  the chips removed, we again confirmed hemostasis with the roller ball with coagulating current.  Upon completion of the entire procedure, the bladder and posterior urethra were reexamined, confirming open prostatic urethra and bladder neck without evidence of bleeding or perforation.  Both ureteral orifices and the external sphincter were noted to be intact.  The resectoscope was withdrawn under direct vision and a 22 French three-way Foley catheter with a 30 cc balloon was inserted into the bladder.  The balloon was inflated with 50 cc of sterile water and the catheter was placed on gentle traction.  After multiple manual irrigations ensuring clear return of the irrigant, the procedure was terminated.  The catheter was attached to a drainage bag and continuous bladder irrigation was started with normal saline.  The patient was positioned supine. He was placed on gentle foley traction with a rubber band.  At the end of the procedure, all counts were correct.  Patient tolerated the procedure well and was taken to the recovery room satisfactory condition.  Plan: Continuous bladder irrigation overnight with gentle Foley traction.  Plan to discharge home tomorrow with Foley catheter in place and void trial in the office in 3 days.  [name]  Pager: [phone]

## 2020-10-25 NOTE — Plan of Care (Signed)
  Problem: Education: Goal: Knowledge of the prescribed therapeutic regimen will improve Outcome: Progressing   Problem: Bowel/Gastric: Goal: Gastrointestinal status for postoperative course will improve Outcome: Progressing   Problem: Skin Integrity: Goal: Demonstration of wound healing without infection will improve Outcome: Progressing   Problem: Urinary Elimination: Goal: Ability to avoid or minimize complications of infection will improve Outcome: Progressing   Problem: Education: Goal: Knowledge of General Education information will improve Description: Including pain rating scale, medication(s)/side effects and non-pharmacologic comfort measures Outcome: Progressing   Problem: Activity: Goal: Risk for activity intolerance will decrease Outcome: Progressing   Problem: Nutrition: Goal: Adequate nutrition will be maintained Outcome: Progressing   Problem: Coping: Goal: Level of anxiety will decrease Outcome: Progressing   Problem: Elimination: Goal: Will not experience complications related to urinary retention Outcome: Progressing   Problem: Pain Managment: Goal: General experience of comfort will improve Outcome: Progressing   Problem: Safety: Goal: Ability to remain free from injury will improve Outcome: Progressing   Problem: Skin Integrity: Goal: Risk for impaired skin integrity will decrease Outcome: Progressing

## 2020-10-25 NOTE — Transfer of Care (Signed)
Immediate Anesthesia Transfer of Care Note  Patient: Juan Cooper  Procedure(s) Performed: ATTEMPTED HOLMIUM ENUCLEATION OF THE PROSTATE WITH MORCELLATION.  CONVERTED TO TURP (N/A )  Patient Location: PACU  Anesthesia Type:General  Level of Consciousness: awake, alert  and oriented  Airway & Oxygen Therapy: Patient Spontanous Breathing and Patient connected to face mask oxygen  Post-op Assessment: Report given to RN and Post -op Vital signs reviewed and stable  Post vital signs: Reviewed and stable  Last Vitals:  Vitals Value Taken Time  BP    Temp    Pulse    Resp    SpO2      Last Pain:  Vitals:   10/25/20 1053  TempSrc:   PainSc: 0-No pain         Complications: No complications documented.

## 2020-10-26 ENCOUNTER — Encounter (HOSPITAL_COMMUNITY): Payer: Self-pay | Admitting: Urology

## 2020-10-26 DIAGNOSIS — R3914 Feeling of incomplete bladder emptying: Secondary | ICD-10-CM | POA: Diagnosis not present

## 2020-10-26 DIAGNOSIS — Z87891 Personal history of nicotine dependence: Secondary | ICD-10-CM | POA: Diagnosis not present

## 2020-10-26 DIAGNOSIS — N401 Enlarged prostate with lower urinary tract symptoms: Secondary | ICD-10-CM | POA: Diagnosis not present

## 2020-10-26 LAB — BASIC METABOLIC PANEL
Anion gap: 9 (ref 5–15)
BUN: 11 mg/dL (ref 8–23)
CO2: 24 mmol/L (ref 22–32)
Calcium: 8.7 mg/dL — ABNORMAL LOW (ref 8.9–10.3)
Chloride: 105 mmol/L (ref 98–111)
Creatinine, Ser: 0.73 mg/dL (ref 0.61–1.24)
GFR, Estimated: >60 mL/min (ref >60)
Glucose, Bld: 137 mg/dL — ABNORMAL HIGH (ref 70–99)
Potassium: 3.8 mmol/L (ref 3.5–5.1)
Sodium: 138 mmol/L (ref 135–145)

## 2020-10-26 LAB — CBC
HCT: 36.2 % — ABNORMAL LOW (ref 39.0–52.0)
Hemoglobin: 12 g/dL — ABNORMAL LOW (ref 13.0–17.0)
MCH: 30.5 pg (ref 26.0–34.0)
MCHC: 33.1 g/dL (ref 30.0–36.0)
MCV: 91.9 fL (ref 80.0–100.0)
Platelets: 248 10*3/uL (ref 150–400)
RBC: 3.94 MIL/uL — ABNORMAL LOW (ref 4.22–5.81)
RDW: 12.5 % (ref 11.5–15.5)
WBC: 14 10*3/uL — ABNORMAL HIGH (ref 4.0–10.5)
nRBC: 0 % (ref 0.0–0.2)

## 2020-10-26 MED ORDER — CHLORHEXIDINE GLUCONATE 0.12 % MT SOLN
15.0000 mL | Freq: Two times a day (BID) | OROMUCOSAL | Status: DC
  Administered 2020-10-26: 15 mL via OROMUCOSAL
  Filled 2020-10-26: qty 15

## 2020-10-26 MED ORDER — CHLORHEXIDINE GLUCONATE CLOTH 2 % EX PADS
6.0000 | MEDICATED_PAD | Freq: Every day | CUTANEOUS | Status: DC
  Administered 2020-10-26: 6 via TOPICAL

## 2020-10-26 MED ORDER — ORAL CARE MOUTH RINSE: 15.0000 mL | Freq: Two times a day (BID) | OROMUCOSAL | Status: DC

## 2020-10-26 NOTE — Discharge Summary (Signed)
Date of admission: 10/25/2020  Date of discharge: 10/26/2020  Admission diagnosis: BPH  Discharge diagnosis: BPH  Secondary diagnoses: None  History and Physical: For full details, please see admission history and physical. Briefly, Juan Cooper is a 75 y.o. year old patient with BPH who underwent TURP on 10/25/2020.   Hospital Course: The patient recovered in the usual expected fashion.  He had his diet advanced slowly.  Initially managed with IV pain control, then transitioned to PO meds when he was tolerating oral intake.  His labs were stable throughout the hospital course.  He was discharged to home on POD#1.  At the time of discharge the patient was tolerating a regular diet, passing flatus, ambulating, had adequate pain control and was agreeable to discharge.  Follow up as scheduled.    Laboratory values:  Recent Labs    10/26/20 0339  HGB 12.0*  HCT 36.2*   Recent Labs    10/26/20 0339  CREATININE 0.73    Disposition: Home  Discharge instruction: The patient was instructed to be ambulatory but told to refrain from heavy lifting, strenuous activity, or driving.   Discharge medications:  Allergies as of 10/26/2020      Reactions   Codeine Other (See Comments)   Irritable       Medication List    TAKE these medications   atorvastatin 20 MG tablet Commonly known as: LIPITOR Take 1 tablet (20 mg total) by mouth daily. What changed: when to take this   cephALEXin 500 MG capsule Commonly known as: KEFLEX Take 1 capsule (500 mg total) by mouth 2 (two) times daily for 3 days.   docusate sodium 100 MG capsule Commonly known as: Colace Take 1 capsule (100 mg total) by mouth daily as needed.   HYDROcodone-acetaminophen 10-325 MG tablet Commonly known as: NORCO Take 1 tablet by mouth 2 (two) times daily as needed for severe pain.   tamsulosin 0.4 MG Caps capsule Commonly known as: FLOMAX Take 0.4 mg by mouth 2 (two) times daily.   tiZANidine 2 MG tablet Commonly  known as: ZANAFLEX Take 2 mg by mouth at bedtime.       Followup:   Follow-up Information    [facility] On 10/28/2020.   Why: 8:15AM Contact information: [address] [place]. [facility]  Pager: [phone]

## 2020-10-26 NOTE — Progress Notes (Signed)
1 Day Post-Op Subjective: Denies pain. No nausea or emesis. Tolerating diet and foley. Afebrile.  Objective: Vital signs in last 24 hours: Temp:  [97.8 F (36.6 C)-98.6 F (37 C)] 98.4 F (36.9 C) (05/03 0415) Pulse Rate:  [71-96] 76 (05/03 0415) Resp:  [14-22] 18 (05/03 0415) BP: (118-161)/(64-92) 118/64 (05/03 0415) SpO2:  [94 %-100 %] 95 % (05/03 0415) Weight:  [96.6 kg] 96.6 kg (05/02 1039)  Intake/Output from previous day: 05/02 0701 - 05/03 0700 In: 23271.8 [P.O.:880; I.V.:2391.8; IV Piggyback:100] Out: 15200 [Urine:14900; Blood:300] Intake/Output this shift: Total I/O In: 131.6 [I.V.:131.6] Out: -    UOP: 14.9L (CBI)  Physical Exam:  General: Alert and oriented CV: RRR Lungs: Clear Abdomen: Soft, ND, NT Ext: NT, No erythema  Lab Results: Recent Labs    10/26/20 0339  HGB 12.0*  HCT 36.2*   BMET Recent Labs    10/26/20 0339  NA 138  K 3.8  CL 105  CO2 24  GLUCOSE 137*  BUN 11  CREATININE 0.73  CALCIUM 8.7*     Studies/Results: No results found.  Assessment/Plan: 1. BPH s/p TURP 10/25/2020  -Pain control prn -Diet as tolerated -Clamp CBI -Reviewed labs, appropriate. -Discharge home with foley in place. VT on Thursday.   LOS: 0 days   Matt R. Ace Bergfeld MD 10/26/2020, 7:58 AM [facility]  Pager: [phone]

## 2020-10-27 LAB — SURGICAL PATHOLOGY

## 2020-11-24 DIAGNOSIS — E785 Hyperlipidemia, unspecified: Secondary | ICD-10-CM | POA: Diagnosis not present

## 2020-11-24 DIAGNOSIS — Z0000 Encounter for general adult medical examination without abnormal findings: Secondary | ICD-10-CM | POA: Diagnosis not present

## 2020-11-24 DIAGNOSIS — R5383 Other fatigue: Secondary | ICD-10-CM | POA: Diagnosis not present

## 2020-11-24 DIAGNOSIS — N400 Enlarged prostate without lower urinary tract symptoms: Secondary | ICD-10-CM | POA: Diagnosis not present

## 2020-11-24 DIAGNOSIS — M549 Dorsalgia, unspecified: Secondary | ICD-10-CM | POA: Diagnosis not present

## 2020-11-24 DIAGNOSIS — G4733 Obstructive sleep apnea (adult) (pediatric): Secondary | ICD-10-CM | POA: Diagnosis not present

## 2020-11-24 DIAGNOSIS — D649 Anemia, unspecified: Secondary | ICD-10-CM | POA: Diagnosis not present

## 2020-11-24 DIAGNOSIS — Z1211 Encounter for screening for malignant neoplasm of colon: Secondary | ICD-10-CM | POA: Diagnosis not present

## 2020-11-26 DIAGNOSIS — Z20822 Contact with and (suspected) exposure to covid-19: Secondary | ICD-10-CM | POA: Diagnosis not present

## 2020-11-26 DIAGNOSIS — R059 Cough, unspecified: Secondary | ICD-10-CM | POA: Diagnosis not present

## 2020-11-26 DIAGNOSIS — J329 Chronic sinusitis, unspecified: Secondary | ICD-10-CM | POA: Diagnosis not present

## 2020-11-26 DIAGNOSIS — R0981 Nasal congestion: Secondary | ICD-10-CM | POA: Diagnosis not present

## 2021-02-08 DIAGNOSIS — D649 Anemia, unspecified: Secondary | ICD-10-CM | POA: Diagnosis not present

## 2021-02-08 DIAGNOSIS — N401 Enlarged prostate with lower urinary tract symptoms: Secondary | ICD-10-CM | POA: Diagnosis not present

## 2021-02-08 DIAGNOSIS — R3912 Poor urinary stream: Secondary | ICD-10-CM | POA: Diagnosis not present

## 2021-02-08 DIAGNOSIS — N3000 Acute cystitis without hematuria: Secondary | ICD-10-CM | POA: Diagnosis not present

## 2021-02-08 DIAGNOSIS — Z125 Encounter for screening for malignant neoplasm of prostate: Secondary | ICD-10-CM | POA: Diagnosis not present

## 2021-04-22 DIAGNOSIS — H524 Presbyopia: Secondary | ICD-10-CM | POA: Diagnosis not present

## 2021-04-22 DIAGNOSIS — H25813 Combined forms of age-related cataract, bilateral: Secondary | ICD-10-CM | POA: Diagnosis not present

## 2021-04-22 DIAGNOSIS — H401114 Primary open-angle glaucoma, right eye, indeterminate stage: Secondary | ICD-10-CM | POA: Diagnosis not present

## 2021-04-29 DIAGNOSIS — G5601 Carpal tunnel syndrome, right upper limb: Secondary | ICD-10-CM | POA: Diagnosis not present

## 2021-04-29 DIAGNOSIS — G5602 Carpal tunnel syndrome, left upper limb: Secondary | ICD-10-CM | POA: Diagnosis not present

## 2021-04-29 DIAGNOSIS — G5603 Carpal tunnel syndrome, bilateral upper limbs: Secondary | ICD-10-CM | POA: Diagnosis not present

## 2021-05-10 ENCOUNTER — Other Ambulatory Visit: Payer: Self-pay | Admitting: Orthopaedic Surgery

## 2021-05-13 NOTE — Progress Notes (Signed)
Sent message, via epic in basket, requesting orders in epic from surgeon.  

## 2021-05-25 ENCOUNTER — Encounter (HOSPITAL_COMMUNITY): Payer: Medicare Other

## 2021-06-01 DIAGNOSIS — Z1211 Encounter for screening for malignant neoplasm of colon: Secondary | ICD-10-CM | POA: Diagnosis not present

## 2021-06-01 DIAGNOSIS — K649 Unspecified hemorrhoids: Secondary | ICD-10-CM | POA: Diagnosis not present

## 2021-06-01 DIAGNOSIS — K5730 Diverticulosis of large intestine without perforation or abscess without bleeding: Secondary | ICD-10-CM | POA: Diagnosis not present

## 2021-06-01 DIAGNOSIS — D125 Benign neoplasm of sigmoid colon: Secondary | ICD-10-CM | POA: Diagnosis not present

## 2021-06-01 NOTE — Patient Instructions (Addendum)
DUE TO COVID-19 ONLY ONE VISITOR IS ALLOWED TO COME WITH YOU AND STAY IN THE WAITING ROOM ONLY DURING PRE OP AND PROCEDURE.   **NO VISITORS ARE ALLOWED IN THE SHORT STAY AREA OR RECOVERY ROOM!!**       Your procedure is scheduled on: 06/07/21   Report to [facility] Main Entrance    Report to admitting at 10:15 AM   Call this number if you have problems the morning of surgery [phone]   Do not eat food :After Midnight.   May have liquids until 9:30 AM day of surgery  CLEAR LIQUID DIET  Foods Allowed                                                                     Foods Excluded  Water, Black Coffee and tea (no milk or creamer)            liquids that you cannot  Plain Jell-O in any flavor  (No red)                                     see through such as: Fruit ices (not with fruit pulp)                                             milk, soups, orange juice              Iced Popsicles (No red)                                                All solid food                                   Apple juices Sports drinks like Gatorade (No red) Lightly seasoned clear broth or consume(fat free) Sugar     The day of surgery:  Drink ONE (1) Pre-Surgery Clear Ensure by 9:30 am the morning of surgery. Drink in one sitting. Do not sip.  This drink was given to you during your hospital  pre-op appointment visit. Nothing else to drink after completing the  Pre-Surgery Clear Ensure.          If you have questions, please contact your surgeon's office.     Oral Hygiene is also important to reduce your risk of infection.                                    Remember - BRUSH YOUR TEETH THE MORNING OF SURGERY WITH YOUR REGULAR TOOTHPASTE   Take these medicines the morning of surgery with A SIP OF WATER: Lipitor, Norco             You may not have any metal on your body including jewelry, and body piercing  Do not wear lotions, powders, cologne, or deodorant               Men may shave face and neck.   Do not bring valuables to the hospital. [place].    Patients discharged on the day of surgery will not be allowed to drive home.   Please bring CPAP mask and tubing day of surgery   Special Instructions: Bring a copy of your healthcare power of attorney and living will documents         the day of surgery if you haven't scanned them before.              Please read over the following fact sheets you were given: IF YOU HAVE QUESTIONS ABOUT YOUR PRE-OP INSTRUCTIONS PLEASE CALL [facility] - Preparing for Surgery Before surgery, you can play an important role.  Because skin is not sterile, your skin needs to be as free of germs as possible.  You can reduce the number of germs on your skin by washing with CHG (chlorahexidine gluconate) soap before surgery.  CHG is an antiseptic cleaner which kills germs and bonds with the skin to continue killing germs even after washing. Please DO NOT use if you have an allergy to CHG or antibacterial soaps.  If your skin becomes reddened/irritated stop using the CHG and inform your nurse when you arrive at Short Stay. Do not shave (including legs and underarms) for at least 48 hours prior to the first CHG shower.  You may shave your face/neck.  Please follow these instructions carefully:  1.  Shower with CHG Soap the night before surgery and the  morning of surgery.  2.  If you choose to wash your hair, wash your hair first as usual with your normal  shampoo.  3.  After you shampoo, rinse your hair and body thoroughly to remove the shampoo.                             4.  Use CHG as you would any other liquid soap.  You can apply chg directly to the skin and wash.  Gently with a scrungie or clean washcloth.  5.  Apply the CHG Soap to your body ONLY FROM THE NECK DOWN.   Do   not use on face/ open                           Wound or open sores. Avoid  contact with eyes, ears mouth and   genitals (private parts).                       Wash face,  Genitals (private parts) with your normal soap.             6.  Wash thoroughly, paying special attention to the area where your    surgery  will be performed.  7.  Thoroughly rinse your body with warm water from the neck down.  8.  DO NOT shower/wash with your normal soap after using and rinsing off the CHG Soap.                9.  Pat yourself dry with a clean towel.  10.  Wear clean pajamas.            11.  Place clean sheets on your bed the night of your first shower and do not  sleep with pets. Day of Surgery : Do not apply any lotions/deodorants the morning of surgery.  Please wear clean clothes to the hospital/surgery center.  FAILURE TO FOLLOW THESE INSTRUCTIONS MAY RESULT IN THE CANCELLATION OF YOUR SURGERY  PATIENT SIGNATURE_________________________________  NURSE SIGNATURE__________________________________  ________________________________________________________________________   Adam Phenix  An incentive spirometer is a tool that can help keep your lungs clear and active. This tool measures how well you are filling your lungs with each breath. Taking long deep breaths may help reverse or decrease the chance of developing breathing (pulmonary) problems (especially infection) following: A long period of time when you are unable to move or be active. BEFORE THE PROCEDURE  If the spirometer includes an indicator to show your best effort, your nurse or respiratory therapist will set it to a desired goal. If possible, sit up straight or lean slightly forward. Try not to slouch. Hold the incentive spirometer in an upright position. INSTRUCTIONS FOR USE  Sit on the edge of your bed if possible, or sit up as far as you can in bed or on a chair. Hold the incentive spirometer in an upright position. Breathe out normally. Place the mouthpiece in your mouth and seal your  lips tightly around it. Breathe in slowly and as deeply as possible, raising the piston or the ball toward the top of the column. Hold your breath for 3-5 seconds or for as long as possible. Allow the piston or ball to fall to the bottom of the column. Remove the mouthpiece from your mouth and breathe out normally. Rest for a few seconds and repeat Steps 1 through 7 at least 10 times every 1-2 hours when you are awake. Take your time and take a few normal breaths between deep breaths. The spirometer may include an indicator to show your best effort. Use the indicator as a goal to work toward during each repetition. After each set of 10 deep breaths, practice coughing to be sure your lungs are clear. If you have an incision (the cut made at the time of surgery), support your incision when coughing by placing a pillow or rolled up towels firmly against it. Once you are able to get out of bed, walk around indoors and cough well. You may stop using the incentive spirometer when instructed by your caregiver.  RISKS AND COMPLICATIONS Take your time so you do not get dizzy or light-headed. If you are in pain, you may need to take or ask for pain medication before doing incentive spirometry. It is harder to take a deep breath if you are having pain. AFTER USE Rest and breathe slowly and easily. It can be helpful to keep track of a log of your progress. Your caregiver can provide you with a simple table to help with this. If you are using the spirometer at home, follow these instructions: [place] IF:  You are having difficultly using the spirometer. You have trouble using the spirometer as often as instructed. Your pain medication is not giving enough relief while using the spirometer. You develop fever of 100.5 F (38.1 C) or higher. SEEK IMMEDIATE MEDICAL CARE IF:  You cough up bloody sputum that had not been present before. You develop fever of 102 F (38.9 C) or greater. You develop  worsening pain at  or near the incision site. MAKE SURE YOU:  Understand these instructions. Will watch your condition. Will get help right away if you are not doing well or get worse. Document Released: 10/23/2006 Document Revised: 09/04/2011 Document Reviewed: 12/24/2006 [facility] Patient Information 2014 [place], [place].   ________________________________________________________________________

## 2021-06-01 NOTE — Progress Notes (Addendum)
COVID swab appointment: n/a  COVID Vaccine Completed: yes x2 Date COVID Vaccine completed: Has received booster: COVID vaccine manufacturer: [employer]   Date of COVID positive in last 90 days: no  PCP - London Pepper, MD Cardiologist - [place]   Chest x-ray - 11/26/20 in ED encounter with Dallie Dad EKG - 06/18/20 on chart Stress Test - 04/24/17 Epic ECHO - n/a Cardiac Cath - n/a Pacemaker/ICD device last checked: n/a Spinal Cord Stimulator: n/a  Sleep Study - yes positive CPAP - yes every night  Fasting Blood Sugar - n/a Checks Blood Sugar _____ times a day  Blood Thinner Instructions: n/a Aspirin Instructions: Last Dose:  Activity level: Can go up a flight of stairs and perform activities of daily living without stopping and without symptoms of chest pain or shortness of breath.    Anesthesia review:   Patient denies shortness of breath, fever, cough and chest pain at PAT appointment   Patient verbalized understanding of instructions that were given to them at the PAT appointment. Patient was also instructed that they will need to review over the PAT instructions again at home before surgery.

## 2021-06-02 ENCOUNTER — Encounter (HOSPITAL_COMMUNITY): Payer: Self-pay

## 2021-06-02 ENCOUNTER — Other Ambulatory Visit: Payer: Self-pay

## 2021-06-02 ENCOUNTER — Encounter (HOSPITAL_COMMUNITY)
Admission: RE | Admit: 2021-06-02 | Discharge: 2021-06-02 | Disposition: A | Discharge status: Home / Self Care | Payer: Medicare Other | Source: Ambulatory Visit | Attending: Orthopaedic Surgery | Admitting: Orthopaedic Surgery

## 2021-06-02 VITALS — BP 141/79 | HR 59 | Temp 98.2°F | Resp 12 | Ht 69.0 in | Wt 212.2 lb

## 2021-06-02 DIAGNOSIS — D649 Anemia, unspecified: Secondary | ICD-10-CM | POA: Diagnosis not present

## 2021-06-02 DIAGNOSIS — Z01812 Encounter for preprocedural laboratory examination: Secondary | ICD-10-CM | POA: Insufficient documentation

## 2021-06-02 HISTORY — DX: Pneumonia, unspecified organism: J18.9

## 2021-06-02 HISTORY — DX: Unspecified osteoarthritis, unspecified site: M19.90

## 2021-06-02 LAB — CBC
HCT: 39.8 % (ref 39.0–52.0)
Hemoglobin: 13.5 g/dL (ref 13.0–17.0)
MCH: 31.4 pg (ref 26.0–34.0)
MCHC: 33.9 g/dL (ref 30.0–36.0)
MCV: 92.6 fL (ref 80.0–100.0)
Platelets: 291 10*3/uL (ref 150–400)
RBC: 4.3 MIL/uL (ref 4.22–5.81)
RDW: 12.3 % (ref 11.5–15.5)
WBC: 6.7 10*3/uL (ref 4.0–10.5)
nRBC: 0 % (ref 0.0–0.2)

## 2021-06-03 DIAGNOSIS — D125 Benign neoplasm of sigmoid colon: Secondary | ICD-10-CM | POA: Diagnosis not present

## 2021-06-06 NOTE — H&P (Signed)
Juan Cooper is an 75 y.o. male.   Chief Complaint: right hand numbness HPI: Juan Cooper is a 75 year old retired man who I know well through knee replacement surgery which we have done on his wife, Juan Cooper.  He is here today with her to discuss his hands.  He has had right greater than left hand pain and numbness for 5-7 years.  This has been managed elsewhere with braces.  He has also had injections.  He has been told he has carpal tunnel syndrome and might need some surgery.  It has become difficult for him to use his hands.  They also ache at night and he has to shake them to wake them up.  The numbness has become constant.  Imaging/Tests: We took 3 views of both hands.  He has some moderate basal joint degeneration but no wrist DJD to speak of.  Past Medical History:  Diagnosis Date   Arthritis    Back pain    Neuromuscular disorder ([place])    peripheral neuropathy   Pneumonia    Sleep apnea    No CPAP    Past Surgical History:  Procedure Laterality Date   HOLEP-LASER ENUCLEATION OF THE PROSTATE WITH MORCELLATION N/A 10/25/2020   Procedure: ATTEMPTED HOLMIUM ENUCLEATION OF THE PROSTATE WITH MORCELLATION.  CONVERTED TO TURP;  Surgeon: Janith Lima, MD;  Location: [facility];  Service: Urology;  Laterality: N/A;   LUMBAR SPINE SURGERY  2011   WISDOM TOOTH EXTRACTION      Family History  Problem Relation Age of Onset   Ovarian cancer Mother    Heart attack Father 55       CABG   Hyperlipidemia Father    Deep vein thrombosis Father        PE, chronic warfarin age 33s on   Stroke Sister    Social History:  reports that he quit smoking about 55 years ago. His smoking use included cigarettes. He has a 1.00 pack-year smoking history. He has never used smokeless tobacco. He reports that he does not drink alcohol and does not use drugs.  Allergies:  Allergies  Allergen Reactions   Codeine Other (See Comments)    Irritable     No medications prior to admission.    No results found  for this or any previous visit (from the past 48 hour(s)). No results found.  Review of Systems  Musculoskeletal:  Positive for arthralgias.       Right hand   There were no vitals taken for this visit. Physical Exam Constitutional:      Appearance: Normal appearance.  HENT:     Head: Normocephalic and atraumatic.     Mouth/Throat:     Pharynx: Oropharynx is clear.  Eyes:     Conjunctiva/sclera: Conjunctivae normal.  Cardiovascular:     Rate and Rhythm: Normal rate and regular rhythm.  Pulmonary:     Effort: Pulmonary effort is normal.  Abdominal:     Palpations: Abdomen is soft.  Musculoskeletal:     Cervical back: Normal range of motion.     Comments: He has good motion of his fingers.  He does have some pain at the basal joint on each side but it does not seem severe.  He also has some thumb MCP pain.  There is no thenar atrophy.  He fails two-point discrimination in median distribution at 10 mm and passes in ulnar distribution.  He has a positive Tinel at the wrist on both sides.  Cervical motion  is full and there is no palpable lymphadenopathy.    Skin:    General: Skin is warm and dry.  Neurological:     General: No focal deficit present.     Mental Status: He is alert and oriented to person, place, and time.  Psychiatric:        Mood and Affect: Mood normal.        Behavior: Behavior normal.        Thought Content: Thought content normal.        Judgment: Judgment normal.     Assessment/Plan Assessment:  Right greater than left carpal tunnel syndrome  Plan: Anis has been struggling with hand numbness for 5-7 years despite bracing and injections.  He has symptoms and signs classic for severe carpal tunnel syndrome.  I think we can forego an expensive painful nerve conduction study and just set him up for a carpal tunnel release on the most bothersome side which is the right.  I reviewed risk of anesthesia, infection, neurovascular injury.   Juan Sachs Winfred Iiams,  PA-C 06/06/2021, 3:00 PM

## 2021-06-07 ENCOUNTER — Ambulatory Visit (HOSPITAL_COMMUNITY): Payer: Medicare Other | Admitting: Anesthesiology

## 2021-06-07 ENCOUNTER — Encounter (HOSPITAL_COMMUNITY)
Admission: RE | Disposition: A | Discharge status: Home / Self Care | Payer: Self-pay | Source: Ambulatory Visit | Attending: Orthopaedic Surgery

## 2021-06-07 ENCOUNTER — Ambulatory Visit (HOSPITAL_COMMUNITY)
Admission: RE | Admit: 2021-06-07 | Discharge: 2021-06-07 | Disposition: A | Discharge status: Home / Self Care | Payer: Medicare Other | Source: Ambulatory Visit | Attending: Orthopaedic Surgery | Admitting: Orthopaedic Surgery

## 2021-06-07 ENCOUNTER — Encounter (HOSPITAL_COMMUNITY): Payer: Self-pay | Admitting: Orthopaedic Surgery

## 2021-06-07 DIAGNOSIS — G4730 Sleep apnea, unspecified: Secondary | ICD-10-CM | POA: Diagnosis not present

## 2021-06-07 DIAGNOSIS — M1990 Unspecified osteoarthritis, unspecified site: Secondary | ICD-10-CM | POA: Diagnosis not present

## 2021-06-07 DIAGNOSIS — Z87891 Personal history of nicotine dependence: Secondary | ICD-10-CM | POA: Diagnosis not present

## 2021-06-07 DIAGNOSIS — N400 Enlarged prostate without lower urinary tract symptoms: Secondary | ICD-10-CM | POA: Diagnosis not present

## 2021-06-07 DIAGNOSIS — G5601 Carpal tunnel syndrome, right upper limb: Secondary | ICD-10-CM | POA: Insufficient documentation

## 2021-06-07 HISTORY — PX: CARPAL TUNNEL RELEASE: SHX101

## 2021-06-07 SURGERY — RELEASE, CARPAL TUNNEL, ENDOSCOPIC
Anesthesia: General | Laterality: Right

## 2021-06-07 MED ORDER — ONDANSETRON HCL 4 MG/2ML IJ SOLN
INTRAMUSCULAR | Status: DC | PRN
  Administered 2021-06-07: 4 mg via INTRAVENOUS

## 2021-06-07 MED ORDER — LIDOCAINE HCL (PF) 2 % IJ SOLN
INTRAMUSCULAR | Status: AC
  Filled 2021-06-07: qty 5

## 2021-06-07 MED ORDER — LIDOCAINE HCL (PF) 1 % IJ SOLN
INTRAMUSCULAR | Status: AC
  Filled 2021-06-07: qty 30

## 2021-06-07 MED ORDER — MIDAZOLAM HCL 5 MG/5ML IJ SOLN
INTRAMUSCULAR | Status: DC | PRN
  Administered 2021-06-07 (×2): 1 mg via INTRAVENOUS

## 2021-06-07 MED ORDER — PROPOFOL 10 MG/ML IV BOLUS
INTRAVENOUS | Status: AC
  Filled 2021-06-07: qty 20

## 2021-06-07 MED ORDER — DEXAMETHASONE SODIUM PHOSPHATE 10 MG/ML IJ SOLN
INTRAMUSCULAR | Status: DC | PRN
  Administered 2021-06-07: 10 mg via INTRAVENOUS

## 2021-06-07 MED ORDER — LIDOCAINE HCL 1 % IJ SOLN
INTRAMUSCULAR | Status: DC | PRN
  Administered 2021-06-07: 40 mg via INTRADERMAL

## 2021-06-07 MED ORDER — FENTANYL CITRATE (PF) 100 MCG/2ML IJ SOLN
INTRAMUSCULAR | Status: AC
  Filled 2021-06-07: qty 2

## 2021-06-07 MED ORDER — ORAL CARE MOUTH RINSE: 15.0000 mL | Freq: Once | OROMUCOSAL | Status: AC

## 2021-06-07 MED ORDER — DEXAMETHASONE SODIUM PHOSPHATE 10 MG/ML IJ SOLN
INTRAMUSCULAR | Status: AC
  Filled 2021-06-07: qty 1

## 2021-06-07 MED ORDER — FENTANYL CITRATE (PF) 100 MCG/2ML IJ SOLN
INTRAMUSCULAR | Status: DC | PRN
  Administered 2021-06-07 (×2): 25 ug via INTRAVENOUS
  Administered 2021-06-07: 50 ug via INTRAVENOUS

## 2021-06-07 MED ORDER — ONDANSETRON HCL 4 MG/2ML IJ SOLN
INTRAMUSCULAR | Status: AC
  Filled 2021-06-07: qty 2

## 2021-06-07 MED ORDER — TRAMADOL HCL 50 MG PO TABS: 50.0000 mg | ORAL_TABLET | Freq: Four times a day (QID) | ORAL | 0 refills | Status: AC | PRN

## 2021-06-07 MED ORDER — LACTATED RINGERS IV SOLN: INTRAVENOUS | Status: DC

## 2021-06-07 MED ORDER — LIDOCAINE HCL (PF) 1 % IJ SOLN
INTRAMUSCULAR | Status: DC | PRN
  Administered 2021-06-07: 5 mL

## 2021-06-07 MED ORDER — PROPOFOL 10 MG/ML IV BOLUS
INTRAVENOUS | Status: DC | PRN
  Administered 2021-06-07 (×2): 10 mg via INTRAVENOUS

## 2021-06-07 MED ORDER — CHLORHEXIDINE GLUCONATE 0.12 % MT SOLN
15.0000 mL | Freq: Once | OROMUCOSAL | Status: AC
  Administered 2021-06-07: 15 mL via OROMUCOSAL

## 2021-06-07 MED ORDER — FENTANYL CITRATE PF 50 MCG/ML IJ SOSY: 25.0000 ug | PREFILLED_SYRINGE | INTRAMUSCULAR | Status: DC | PRN

## 2021-06-07 MED ORDER — PROPOFOL 500 MG/50ML IV EMUL
INTRAVENOUS | Status: DC | PRN
  Administered 2021-06-07: 75 ug/kg/min via INTRAVENOUS

## 2021-06-07 MED ORDER — 0.9 % SODIUM CHLORIDE (POUR BTL) OPTIME
TOPICAL | Status: DC | PRN
  Administered 2021-06-07: 1000 mL

## 2021-06-07 MED ORDER — AMISULPRIDE (ANTIEMETIC) 5 MG/2ML IV SOLN: 10.0000 mg | Freq: Once | INTRAVENOUS | Status: DC | PRN

## 2021-06-07 MED ORDER — MIDAZOLAM HCL 2 MG/2ML IJ SOLN
INTRAMUSCULAR | Status: AC
  Filled 2021-06-07: qty 2

## 2021-06-07 MED ORDER — CEFAZOLIN SODIUM-DEXTROSE 2-4 GM/100ML-% IV SOLN
2.0000 g | INTRAVENOUS | Status: AC
  Administered 2021-06-07: 2 g via INTRAVENOUS
  Filled 2021-06-07: qty 100

## 2021-06-07 MED ORDER — BUPIVACAINE-EPINEPHRINE 0.25% -1:200000 IJ SOLN
INTRAMUSCULAR | Status: DC | PRN
  Administered 2021-06-07: 5 mL

## 2021-06-07 MED ORDER — ONDANSETRON HCL 4 MG/2ML IJ SOLN: 4.0000 mg | Freq: Once | INTRAMUSCULAR | Status: DC | PRN

## 2021-06-07 MED ORDER — BUPIVACAINE-EPINEPHRINE (PF) 0.25% -1:200000 IJ SOLN
INTRAMUSCULAR | Status: AC
  Filled 2021-06-07: qty 30

## 2021-06-07 SURGICAL SUPPLY — 52 items
BAG COUNTER SPONGE SURGICOUNT (BAG) IMPLANT
BAG SURGICOUNT SPONGE COUNTING (BAG)
BLADE MINI RND TIP GREEN BEAV (BLADE) IMPLANT
BLADE SURG 15 STRL LF DISP TIS (BLADE) ×1 IMPLANT
BLADE SURG 15 STRL SS (BLADE) ×2
BNDG COHESIVE 4X5 TAN ST LF (GAUZE/BANDAGES/DRESSINGS) ×2 IMPLANT
BNDG ELASTIC 2X5.8 VLCR STR LF (GAUZE/BANDAGES/DRESSINGS) IMPLANT
BNDG ELASTIC 4X5.8 VLCR STR LF (GAUZE/BANDAGES/DRESSINGS) ×3 IMPLANT
BNDG ESMARK 4X9 LF (GAUZE/BANDAGES/DRESSINGS) ×3 IMPLANT
BNDG GAUZE ELAST 4 BULKY (GAUZE/BANDAGES/DRESSINGS) ×3 IMPLANT
BNDG PLASTER X FAST 3X3 WHT LF (CAST SUPPLIES) IMPLANT
CORD BIPOLAR FORCEPS 12FT (ELECTRODE) ×1 IMPLANT
COVER BACK TABLE 60X90IN (DRAPES) ×3 IMPLANT
COVER MAYO STAND STRL (DRAPES) ×3 IMPLANT
CUFF TOURN SGL QUICK 18X4 (TOURNIQUET CUFF) IMPLANT
DRAPE EXTREMITY T 121X128X90 (DISPOSABLE) ×3 IMPLANT
DRAPE SURG 17X23 STRL (DRAPES) ×3 IMPLANT
DRAPE U-SHAPE 47X51 STRL (DRAPES) ×3 IMPLANT
DRSG EMULSION OIL 3X3 NADH (GAUZE/BANDAGES/DRESSINGS) ×3 IMPLANT
DRSG PAD ABDOMINAL 8X10 ST (GAUZE/BANDAGES/DRESSINGS) IMPLANT
DURAPREP 26ML APPLICATOR (WOUND CARE) ×3 IMPLANT
ELECT NDL TIP 2.8 STRL (NEEDLE) IMPLANT
ELECT NEEDLE TIP 2.8 STRL (NEEDLE) IMPLANT
ELECT REM PT RETURN 15FT ADLT (MISCELLANEOUS) ×3 IMPLANT
GAUZE 4X4 16PLY ~~LOC~~+RFID DBL (SPONGE) ×2 IMPLANT
GAUZE SPONGE 4X4 12PLY STRL (GAUZE/BANDAGES/DRESSINGS) ×3 IMPLANT
GAUZE XEROFORM 1X8 LF (GAUZE/BANDAGES/DRESSINGS) ×2 IMPLANT
GLOVE SRG 8 PF TXTR STRL LF DI (GLOVE) ×2 IMPLANT
GLOVE SURG ENC MOIS LTX SZ8 (GLOVE) ×6 IMPLANT
GLOVE SURG UNDER POLY LF SZ8 (GLOVE) ×4
GOWN STRL REUS W/ TWL XL LVL3 (GOWN DISPOSABLE) ×2 IMPLANT
GOWN STRL REUS W/TWL XL LVL3 (GOWN DISPOSABLE) ×4
KIT BASIN OR (CUSTOM PROCEDURE TRAY) ×3 IMPLANT
KIT TURNOVER KIT A (KITS) IMPLANT
LOOP VESSEL MAXI BLUE (MISCELLANEOUS) IMPLANT
NDL HYPO 25X1 1.5 SAFETY (NEEDLE) IMPLANT
NEEDLE HYPO 25X1 1.5 SAFETY (NEEDLE) IMPLANT
NS IRRIG 1000ML POUR BTL (IV SOLUTION) ×3 IMPLANT
PACK ORTHO EXTREMITY (CUSTOM PROCEDURE TRAY) ×2 IMPLANT
PAD CAST 4YDX4 CTTN HI CHSV (CAST SUPPLIES) ×1 IMPLANT
PADDING CAST ABS 4INX4YD NS (CAST SUPPLIES)
PADDING CAST ABS COTTON 4X4 ST (CAST SUPPLIES) IMPLANT
PADDING CAST COTTON 4X4 STRL (CAST SUPPLIES) ×2
PENCIL SMOKE EVACUATOR (MISCELLANEOUS) IMPLANT
STOCKINETTE 4X48 STRL (DRAPES) ×3 IMPLANT
SUT ETHILON 3 0 PS 1 (SUTURE) ×3 IMPLANT
SUT ETHILON 4 0 PS 2 18 (SUTURE) ×3 IMPLANT
SUT VIC AB 4-0 P-3 18XBRD (SUTURE) IMPLANT
SUT VIC AB 4-0 P3 18 (SUTURE)
SYR CONTROL 10ML LL (SYRINGE) IMPLANT
TOWEL OR 17X26 10 PK STRL BLUE (TOWEL DISPOSABLE) ×3 IMPLANT
UNDERPAD 30X36 HEAVY ABSORB (UNDERPADS AND DIAPERS) ×3 IMPLANT

## 2021-06-07 NOTE — Transfer of Care (Signed)
Immediate Anesthesia Transfer of Care Note  Patient: Juan Cooper  Procedure(s) Performed: RIGHT CARPAL TUNNEL RELEASE ENDOSCOPIC (Right)  Patient Location: PACU  Anesthesia Type:MAC  Level of Consciousness: awake, alert , oriented and patient cooperative  Airway & Oxygen Therapy: Patient Spontanous Breathing and Patient connected to face mask oxygen  Post-op Assessment: Report given to RN and Post -op Vital signs reviewed and stable  Post vital signs: Reviewed and stable  Last Vitals:  Vitals Value Taken Time  BP 126/73 06/07/21 1322  Temp    Pulse 55 06/07/21 1323  Resp 14 06/07/21 1323  SpO2 99 % 06/07/21 1323  Vitals shown include unvalidated device data.  Last Pain:  Vitals:   06/07/21 1038  TempSrc:   PainSc: 3          Complications: No notable events documented.

## 2021-06-07 NOTE — Anesthesia Preprocedure Evaluation (Addendum)
Anesthesia Evaluation   Patient awake    Reviewed: Allergy & Precautions, H&P , NPO status , Patient's Chart, lab work & pertinent test results  Airway Mallampati: II  TM Distance: >3 FB Neck ROM: Full    Dental no notable dental hx.    Pulmonary sleep apnea , former smoker,    Pulmonary exam normal breath sounds clear to auscultation       Cardiovascular Exercise Tolerance: Good Normal cardiovascular exam Rhythm:Regular Rate:Normal  Sinus rhythm with frequent premature ventricular complexes  Otherwise normal ECG  No previous ECGs available  Confirmed by Juanda Chance [phone]) on 06/18/2020 5:29:42 PM    Neuro/Psych Peripheral neuropathy negative psych ROS   GI/Hepatic negative GI ROS, Neg liver ROS,   Endo/Other  obesity  Renal/GU negative Renal ROS  negative genitourinary   Musculoskeletal  (+) Arthritis , Osteoarthritis,    Abdominal   Peds negative pediatric ROS (+)  Hematology negative hematology ROS (+)   Anesthesia Other Findings   Reproductive/Obstetrics negative OB ROS                            Anesthesia Physical Anesthesia Plan  ASA: 3  Anesthesia Plan: General   Post-op Pain Management:    Induction:   PONV Risk Score and Plan: 2  Airway Management Planned: LMA  Additional Equipment: None  Intra-op Plan:   Post-operative Plan: Extubation in OR  Informed Consent: I have reviewed the patients History and Physical, chart, labs and discussed the procedure including the risks, benefits and alternatives for the proposed anesthesia with the patient or authorized representative who has indicated his/her understanding and acceptance.     Dental advisory given  Plan Discussed with: CRNA and Anesthesiologist  Anesthesia Plan Comments:         Anesthesia Quick Evaluation

## 2021-06-07 NOTE — Interval H&P Note (Signed)
History and Physical Interval Note:  06/07/2021 11:43 AM  Juan Cooper  has presented today for surgery, with the diagnosis of carpal tunnel syndrom.  The various methods of treatment have been discussed with the patient and family. After consideration of risks, benefits and other options for treatment, the patient has consented to  Procedure(s): RIGHT CARPAL TUNNEL RELEASE ENDOSCOPIC (Right) as a surgical intervention.  The patient's history has been reviewed, patient examined, no change in status, stable for surgery.  I have reviewed the patient's chart and labs.  Questions were answered to the patient's satisfaction.     [name]

## 2021-06-07 NOTE — Op Note (Signed)
PRE-OP DIAGNOSIS:  right carpal tunnel syndrome POST-OP DIAGNOSIS:  same PROCEDURE:  right carpal tunnel release ANESTHESIA:  local and MAC ATTENDING SURGEON:  Melrose Nakayama MD ASSISTANT:  Loni Dolly PA  INDICATION FOR PROCEDURE:  Juan Cooper is a 75 y.o. male with a long history of hand pain and numbness.  The patient has failed conservative measures of treatment and persists with symptoms which are very limiting.  The patient is offered carpal tunnel release in hopes of improving their situation by relieving pressure on the medial nerve.  Informed operative consent was obtained after discussion of possible complications including reaction to anesthesia, infection, and neurovascular injury.  SUMMARY OF FINDINGS AND PROCEDURE:  Under to above anesthetic a carpal tunnel release was performed.  The patient had a severely thickened transverse carpal tunnel ligament which was applying compression to the underlying medial nerve.  This was release under direct visualization.  The skin was closed primarily followed by placement of a sterile dressing and splint with the wrist in slight extension.  DESCRIPTION OF PROCEDURE:  Juan Cooper was taken to an operative suite where the above anesthetic was applied.  The patient was then prepped and draped in normal sterile fashion.  An appropriate time out was performed.  After the administration of kefzol pre-operative antibiotic and application and inflation of a tourniquet a small incision was made on the palmar aspect of the palm.  This did not cross the wrist flexion crease and was ulnar to the thenar flexion crease.  Dissection was carried down through palmar fascia to expose the transverse carpal ligament which was released under direct visualization.  The dissection was taken distally towards the transverse arch of vessels and distally through the distal fascia of the forearm.  The wound was irrigated and the skin was closed with nylon.  The tourniquet  was deflated and skin edges became pink immediately and pulses were intact.  Adaptic was applied along with a sterile dressing and bulky wrap.  Estimated blood loss and intraoperative fluids can be obtained from anesthesia records as can tourniquet time.  DISPOSITION:  Juan Cooper was taken to recovery room in stable condition.  Plans were to go home same day and I will contact the patient by phone tonight.  The patient will follow-up in about a week in the office.  [name] 06/07/2021, 1:10 PM

## 2021-06-07 NOTE — Anesthesia Postprocedure Evaluation (Signed)
Anesthesia Post Note  Patient: Juan Cooper  Procedure(s) Performed: RIGHT CARPAL TUNNEL RELEASE ENDOSCOPIC (Right)     Patient location during evaluation: PACU Anesthesia Type: General Level of consciousness: awake Pain management: pain level controlled Vital Signs Assessment: post-procedure vital signs reviewed and stable Respiratory status: spontaneous breathing and respiratory function stable Cardiovascular status: stable Postop Assessment: no apparent nausea or vomiting Anesthetic complications: no   No notable events documented.  Last Vitals:  Vitals:   06/07/21 1345 06/07/21 1400  BP: 134/77 (!) 154/76  Pulse: (!) 54 (!) 59  Resp: 16 17  Temp: 36.5 C   SpO2: 97% 96%    Last Pain:  Vitals:   06/07/21 1345  TempSrc:   PainSc: 0-No pain                 Merlinda Frederick

## 2021-06-08 ENCOUNTER — Encounter (HOSPITAL_COMMUNITY): Payer: Self-pay | Admitting: Orthopaedic Surgery

## 2021-08-26 DIAGNOSIS — H40023 Open angle with borderline findings, high risk, bilateral: Secondary | ICD-10-CM | POA: Diagnosis not present

## 2021-10-12 DIAGNOSIS — G4733 Obstructive sleep apnea (adult) (pediatric): Secondary | ICD-10-CM | POA: Diagnosis not present

## 2021-11-08 ENCOUNTER — Other Ambulatory Visit: Payer: Self-pay | Admitting: Neurosurgery

## 2021-11-08 DIAGNOSIS — M5416 Radiculopathy, lumbar region: Secondary | ICD-10-CM

## 2021-11-20 ENCOUNTER — Ambulatory Visit
Admission: RE | Admit: 2021-11-20 | Discharge: 2021-11-20 | Disposition: A | Discharge status: Home / Self Care | Payer: Medicare Other | Source: Ambulatory Visit | Attending: Neurosurgery | Admitting: Neurosurgery

## 2021-11-20 DIAGNOSIS — M4316 Spondylolisthesis, lumbar region: Secondary | ICD-10-CM | POA: Diagnosis not present

## 2021-11-20 DIAGNOSIS — M5416 Radiculopathy, lumbar region: Secondary | ICD-10-CM

## 2021-11-20 DIAGNOSIS — M545 Low back pain, unspecified: Secondary | ICD-10-CM | POA: Diagnosis not present

## 2021-12-01 ENCOUNTER — Other Ambulatory Visit: Payer: Self-pay | Admitting: Family Medicine

## 2021-12-01 ENCOUNTER — Ambulatory Visit
Admission: RE | Admit: 2021-12-01 | Discharge: 2021-12-01 | Disposition: A | Discharge status: Home / Self Care | Payer: Medicare Other | Source: Ambulatory Visit | Attending: Family Medicine | Admitting: Family Medicine

## 2021-12-01 DIAGNOSIS — R059 Cough, unspecified: Secondary | ICD-10-CM

## 2021-12-01 DIAGNOSIS — M549 Dorsalgia, unspecified: Secondary | ICD-10-CM | POA: Diagnosis not present

## 2021-12-01 DIAGNOSIS — G4733 Obstructive sleep apnea (adult) (pediatric): Secondary | ICD-10-CM | POA: Diagnosis not present

## 2021-12-01 DIAGNOSIS — N400 Enlarged prostate without lower urinary tract symptoms: Secondary | ICD-10-CM | POA: Diagnosis not present

## 2021-12-01 DIAGNOSIS — E785 Hyperlipidemia, unspecified: Secondary | ICD-10-CM | POA: Diagnosis not present

## 2021-12-01 DIAGNOSIS — Z23 Encounter for immunization: Secondary | ICD-10-CM | POA: Diagnosis not present

## 2021-12-01 DIAGNOSIS — Z0000 Encounter for general adult medical examination without abnormal findings: Secondary | ICD-10-CM | POA: Diagnosis not present

## 2021-12-01 DIAGNOSIS — R5383 Other fatigue: Secondary | ICD-10-CM | POA: Diagnosis not present

## 2021-12-02 DIAGNOSIS — R5383 Other fatigue: Secondary | ICD-10-CM | POA: Diagnosis not present

## 2021-12-02 DIAGNOSIS — E559 Vitamin D deficiency, unspecified: Secondary | ICD-10-CM | POA: Diagnosis not present

## 2021-12-02 DIAGNOSIS — R7309 Other abnormal glucose: Secondary | ICD-10-CM | POA: Diagnosis not present

## 2021-12-02 DIAGNOSIS — E785 Hyperlipidemia, unspecified: Secondary | ICD-10-CM | POA: Diagnosis not present

## 2021-12-02 DIAGNOSIS — Z0000 Encounter for general adult medical examination without abnormal findings: Secondary | ICD-10-CM | POA: Diagnosis not present

## 2022-03-03 DIAGNOSIS — H40023 Open angle with borderline findings, high risk, bilateral: Secondary | ICD-10-CM | POA: Diagnosis not present

## 2022-03-03 DIAGNOSIS — H25813 Combined forms of age-related cataract, bilateral: Secondary | ICD-10-CM | POA: Diagnosis not present

## 2022-03-03 DIAGNOSIS — H2513 Age-related nuclear cataract, bilateral: Secondary | ICD-10-CM | POA: Diagnosis not present

## 2022-03-31 DIAGNOSIS — R3915 Urgency of urination: Secondary | ICD-10-CM | POA: Diagnosis not present

## 2022-03-31 DIAGNOSIS — Z125 Encounter for screening for malignant neoplasm of prostate: Secondary | ICD-10-CM | POA: Diagnosis not present

## 2022-03-31 DIAGNOSIS — N401 Enlarged prostate with lower urinary tract symptoms: Secondary | ICD-10-CM | POA: Diagnosis not present

## 2022-04-20 DIAGNOSIS — L820 Inflamed seborrheic keratosis: Secondary | ICD-10-CM | POA: Diagnosis not present

## 2022-04-20 DIAGNOSIS — L988 Other specified disorders of the skin and subcutaneous tissue: Secondary | ICD-10-CM | POA: Diagnosis not present

## 2022-04-20 DIAGNOSIS — C44319 Basal cell carcinoma of skin of other parts of face: Secondary | ICD-10-CM | POA: Diagnosis not present

## 2022-04-20 DIAGNOSIS — D485 Neoplasm of uncertain behavior of skin: Secondary | ICD-10-CM | POA: Diagnosis not present

## 2023-04-30 ENCOUNTER — Other Ambulatory Visit: Payer: Self-pay | Admitting: Student

## 2023-04-30 DIAGNOSIS — M48062 Spinal stenosis, lumbar region with neurogenic claudication: Secondary | ICD-10-CM

## 2023-05-07 NOTE — Discharge Instructions (Signed)

## 2023-05-08 ENCOUNTER — Inpatient Hospital Stay
Admission: RE | Admit: 2023-05-08 | Discharge: 2023-05-08 | Disposition: A | Discharge status: Home / Self Care | Payer: Medicare Other | Source: Ambulatory Visit | Attending: Student | Admitting: Student

## 2023-05-08 ENCOUNTER — Other Ambulatory Visit: Payer: Medicare Other

## 2023-05-15 NOTE — Discharge Instructions (Signed)

## 2023-05-16 ENCOUNTER — Ambulatory Visit
Admission: RE | Admit: 2023-05-16 | Discharge: 2023-05-16 | Disposition: A | Discharge status: Home / Self Care | Payer: Medicare Other | Source: Ambulatory Visit | Attending: Student | Admitting: Student

## 2023-05-16 ENCOUNTER — Ambulatory Visit
Admission: RE | Admit: 2023-05-16 | Discharge: 2023-05-16 | Disposition: A | Discharge status: Home / Self Care | Payer: Medicare Other | Source: Ambulatory Visit | Attending: Student

## 2023-05-16 DIAGNOSIS — M48062 Spinal stenosis, lumbar region with neurogenic claudication: Secondary | ICD-10-CM

## 2023-05-16 MED ORDER — ONDANSETRON HCL 4 MG/2ML IJ SOLN: 4.0000 mg | Freq: Once | INTRAMUSCULAR | Status: DC | PRN

## 2023-05-16 MED ORDER — IOPAMIDOL (ISOVUE-M 200) INJECTION 41%
20.0000 mL | Freq: Once | INTRAMUSCULAR | Status: AC
  Administered 2023-05-16: 20 mL via INTRATHECAL

## 2023-05-16 MED ORDER — DIAZEPAM 5 MG PO TABS: 5.0000 mg | ORAL_TABLET | Freq: Once | ORAL | Status: DC

## 2023-05-16 MED ORDER — MEPERIDINE HCL 50 MG/ML IJ SOLN: 50.0000 mg | Freq: Once | INTRAMUSCULAR | Status: DC | PRN

## 2024-03-16 IMAGING — CR DG CHEST 2V
2 series · 2 of 2 positions shown · non-contrast
Comparison: None Available.

CLINICAL DATA: Cough and fatigue.

EXAM:
CHEST - 2 VIEW

[w chest pa]
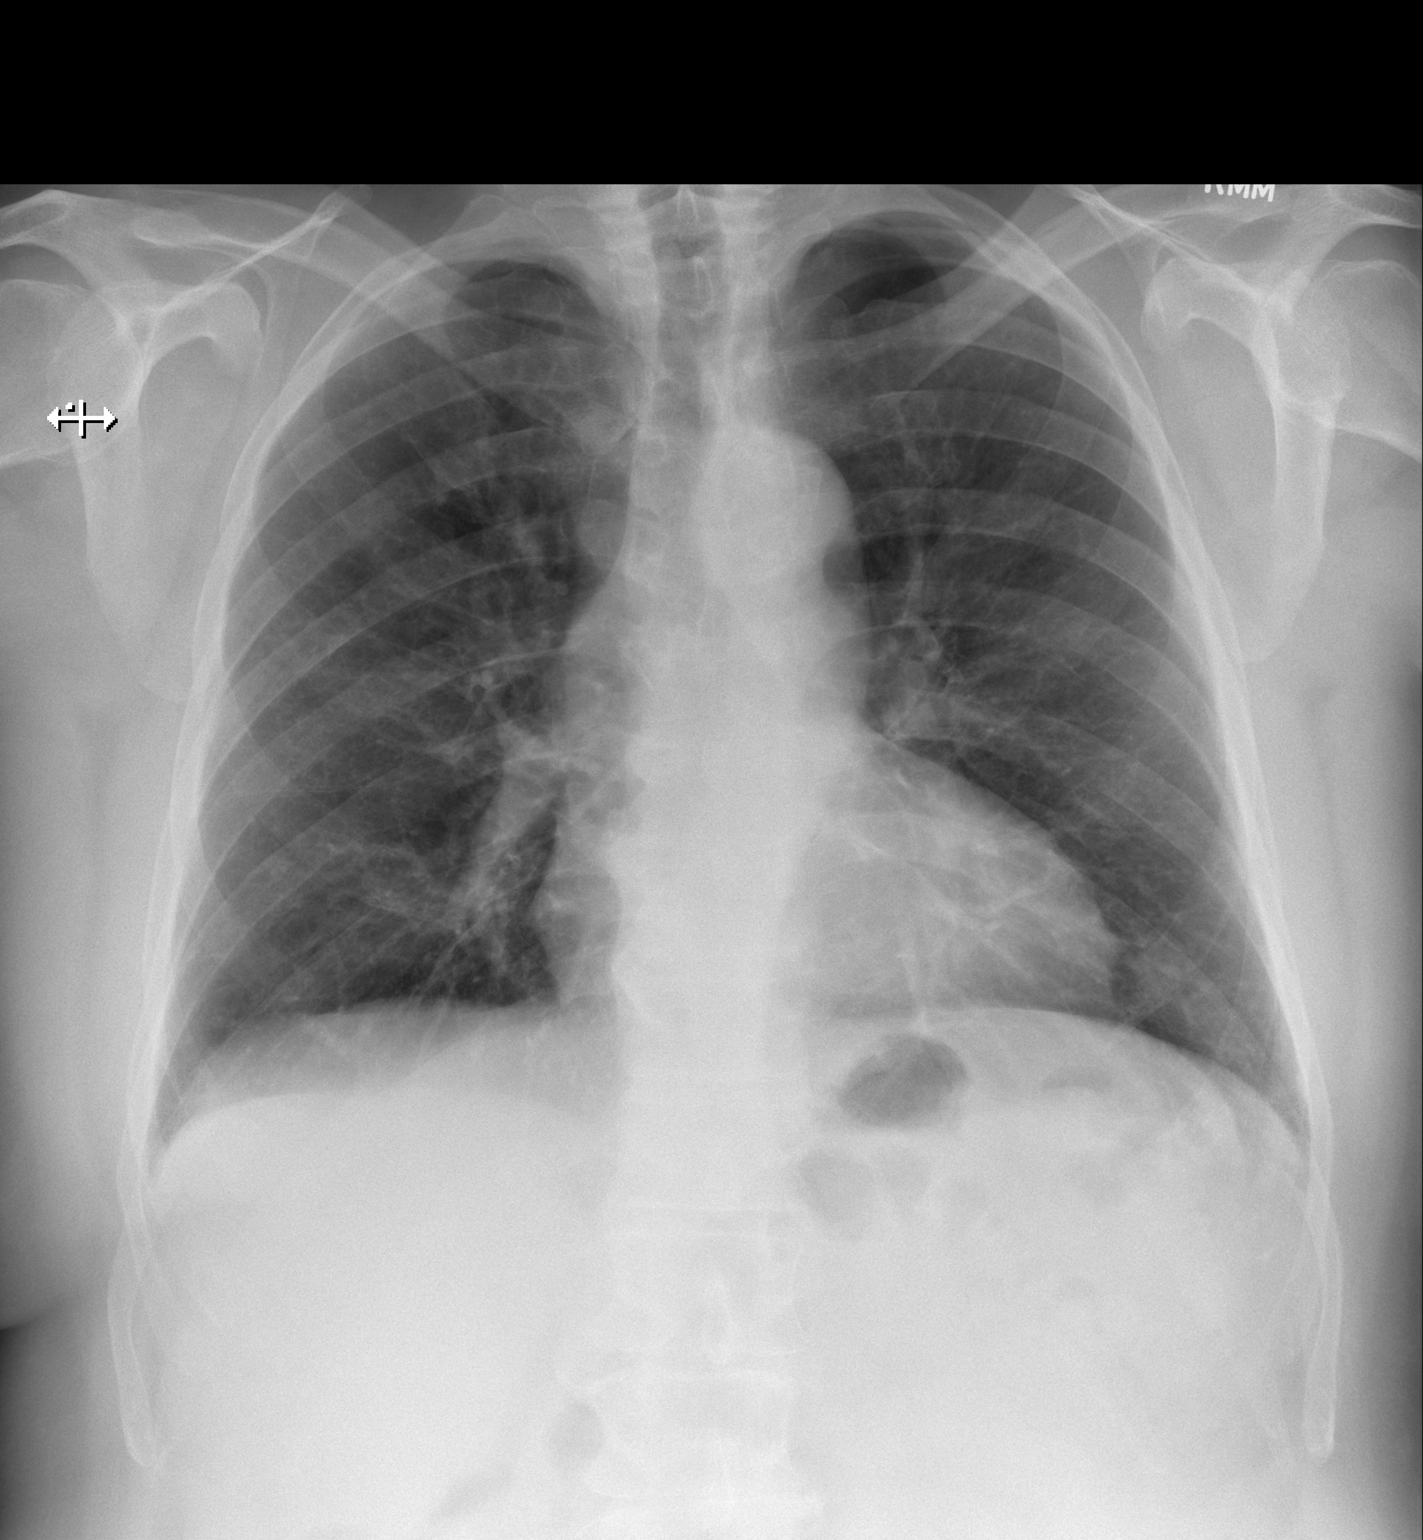

[w chest lat]
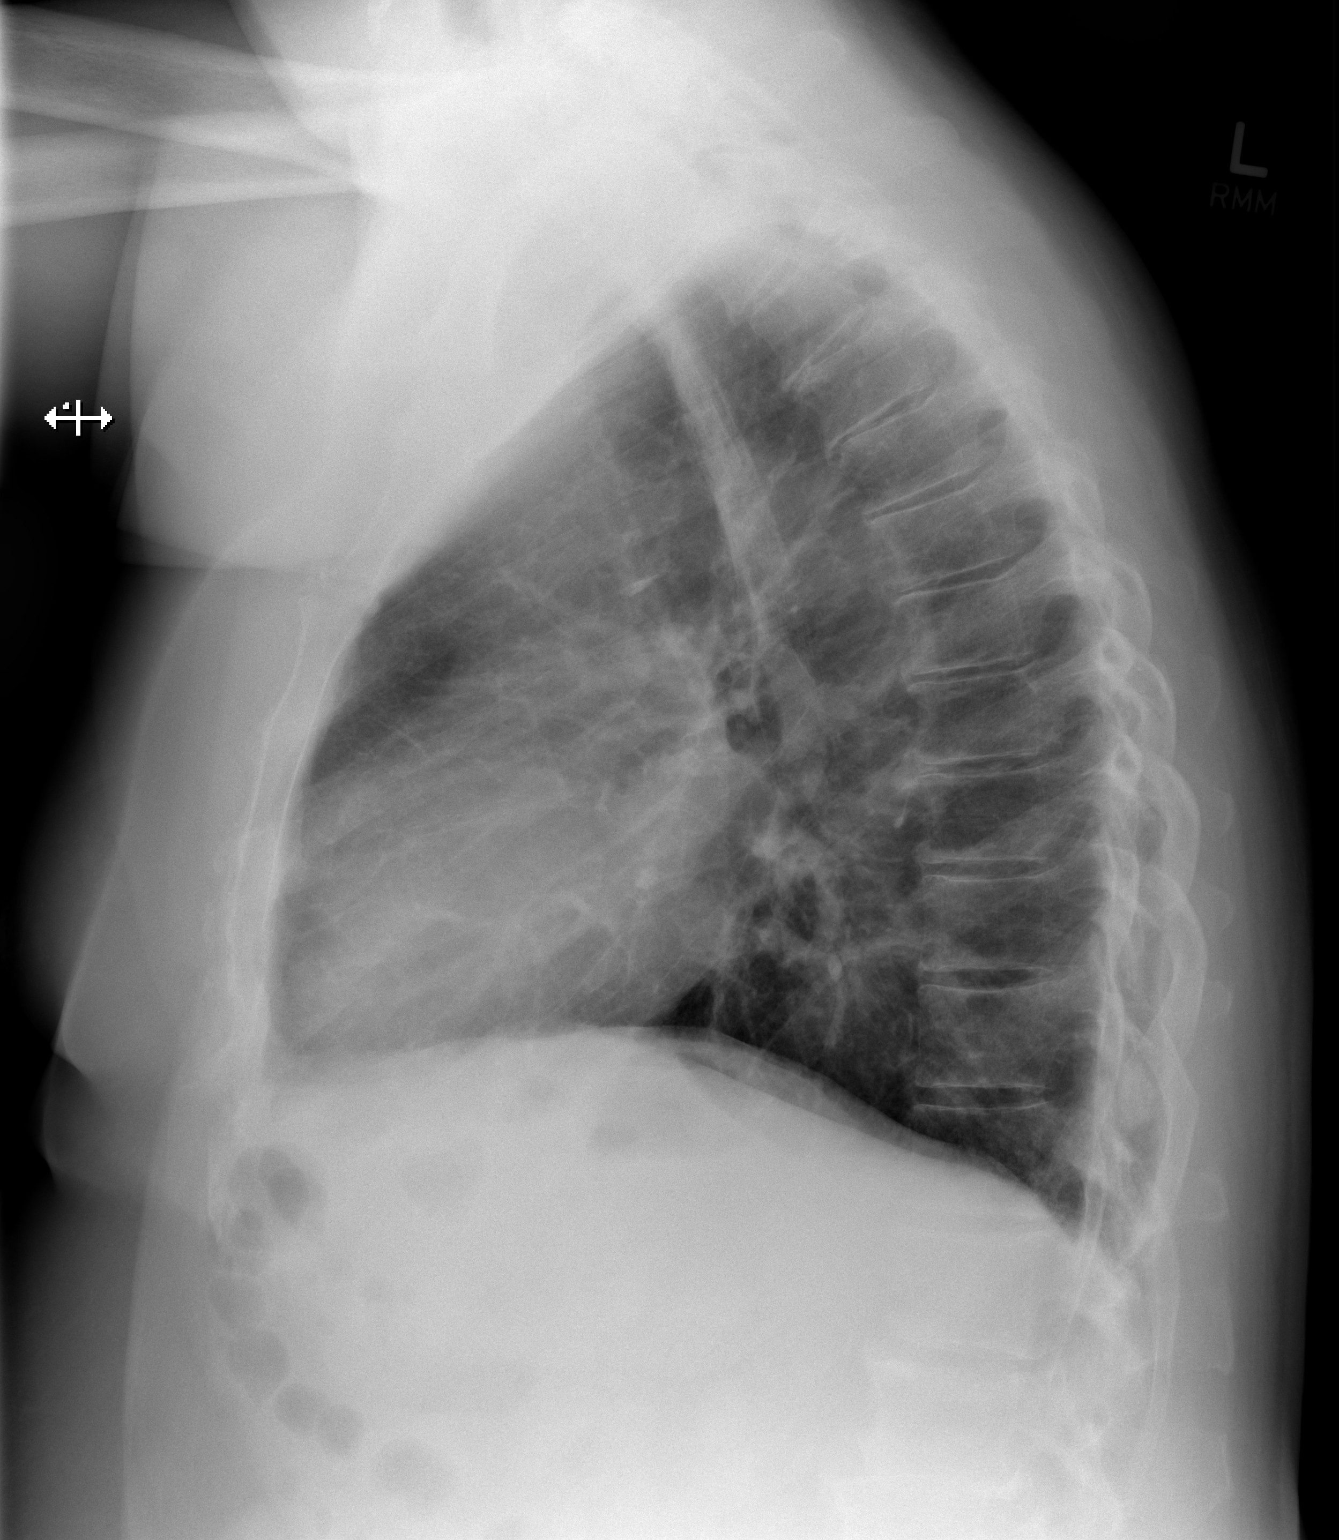

[2 of 2 positions shown; findings below may reference images not displayed]

FINDINGS: The heart size and mediastinal contours are within normal limits.
Both lungs are clear. The visualized skeletal structures are
unremarkable.
IMPRESSION: No active cardiopulmonary disease.
